# Patient Record
Sex: Female | Born: 1945 | Race: Black or African American | Hispanic: No | Smoking: Never smoker
Health system: Southern US, Community
[De-identification: ages and names within clinical notes are randomized; demographics above are authoritative.]

## PROBLEM LIST (undated history)

## (undated) DIAGNOSIS — I340 Nonrheumatic mitral (valve) insufficiency: Secondary | ICD-10-CM

## (undated) DIAGNOSIS — K219 Gastro-esophageal reflux disease without esophagitis: Secondary | ICD-10-CM

## (undated) DIAGNOSIS — I351 Nonrheumatic aortic (valve) insufficiency: Secondary | ICD-10-CM

## (undated) DIAGNOSIS — E538 Deficiency of other specified B group vitamins: Secondary | ICD-10-CM

## (undated) DIAGNOSIS — N182 Chronic kidney disease, stage 2 (mild): Secondary | ICD-10-CM

## (undated) DIAGNOSIS — M810 Age-related osteoporosis without current pathological fracture: Secondary | ICD-10-CM

## (undated) DIAGNOSIS — E7800 Pure hypercholesterolemia, unspecified: Secondary | ICD-10-CM

## (undated) DIAGNOSIS — I10 Essential (primary) hypertension: Secondary | ICD-10-CM

## (undated) DIAGNOSIS — D126 Benign neoplasm of colon, unspecified: Secondary | ICD-10-CM

## (undated) DIAGNOSIS — D709 Neutropenia, unspecified: Secondary | ICD-10-CM

## (undated) DIAGNOSIS — E559 Vitamin D deficiency, unspecified: Secondary | ICD-10-CM

---

## 2005-07-30 ENCOUNTER — Ambulatory Visit: Payer: Self-pay | Admitting: Internal Medicine

## 2006-01-08 ENCOUNTER — Ambulatory Visit: Payer: Self-pay | Admitting: Gastroenterology

## 2006-09-02 ENCOUNTER — Ambulatory Visit: Payer: Self-pay | Admitting: Internal Medicine

## 2007-09-06 ENCOUNTER — Ambulatory Visit: Payer: Self-pay | Admitting: Internal Medicine

## 2007-09-14 ENCOUNTER — Ambulatory Visit: Payer: Self-pay | Admitting: Internal Medicine

## 2008-07-20 ENCOUNTER — Emergency Department: Payer: Self-pay | Admitting: Emergency Medicine

## 2008-09-14 ENCOUNTER — Ambulatory Visit: Payer: Self-pay | Admitting: Internal Medicine

## 2009-08-14 ENCOUNTER — Ambulatory Visit: Payer: Self-pay | Admitting: Gastroenterology

## 2009-09-17 ENCOUNTER — Ambulatory Visit: Payer: Self-pay | Admitting: Internal Medicine

## 2009-09-19 ENCOUNTER — Ambulatory Visit: Payer: Self-pay | Admitting: Internal Medicine

## 2010-09-30 ENCOUNTER — Ambulatory Visit: Payer: Self-pay | Admitting: Internal Medicine

## 2011-04-06 ENCOUNTER — Emergency Department: Payer: Self-pay | Admitting: *Deleted

## 2011-04-09 ENCOUNTER — Ambulatory Visit: Payer: Self-pay | Admitting: Specialist

## 2011-04-11 ENCOUNTER — Ambulatory Visit: Payer: Self-pay | Admitting: Specialist

## 2011-08-30 ENCOUNTER — Emergency Department: Payer: Self-pay | Admitting: Emergency Medicine

## 2011-10-01 ENCOUNTER — Ambulatory Visit: Payer: Self-pay | Admitting: Internal Medicine

## 2011-10-02 ENCOUNTER — Ambulatory Visit: Payer: Self-pay | Admitting: Internal Medicine

## 2012-04-27 ENCOUNTER — Ambulatory Visit: Payer: Self-pay | Admitting: Internal Medicine

## 2012-10-05 ENCOUNTER — Ambulatory Visit: Payer: Self-pay | Admitting: Internal Medicine

## 2013-10-19 ENCOUNTER — Ambulatory Visit: Payer: Self-pay | Admitting: Internal Medicine

## 2013-11-01 ENCOUNTER — Ambulatory Visit: Payer: Self-pay | Admitting: Internal Medicine

## 2014-04-30 NOTE — Op Note (Signed)
PATIENT NAME:  Melanie Ali, Melanie Ali MR#:  [id number] DATE OF BIRTH:  21-Apr-1945  DATE OF PROCEDURE:  04/11/2011  PREOPERATIVE DIAGNOSIS: Displaced, depressed left calcaneus fracture.   POSTOPERATIVE DIAGNOSIS: Displaced, depressed left calcaneus fracture.   PROCEDURE:  Percutaneous reduction and internal fixation left calcaneus fracture.   SURGEON: Christophe Louis, M.D.   ANESTHESIA: General.   COMPLICATIONS: None.   TOURNIQUET TIME: Approximately 80 minutes.   DESCRIPTION OF PROCEDURE:  One gram of Ancef was given intravenously prior to the procedure. General anesthesia is induced and the patient is turned over into the prone position. The left foot, ankle, and leg are thoroughly prepped with alcohol and ChloraPrep and draped in standard sterile fashion. Under fluoroscopic control a Steinmann pin is inserted into the posterior heel for use as traction device. This is connected to a traction bow and a sterile cord and this was placed over a Mayo stand with 10 pounds longitudinal traction. In addition the fracture was manually manipulated by myself to provide longitudinal traction. Under fluoroscopic control a medium-sized K-wire was then inserted directly beneath the depressed lateral fragment of the posterior facet, and then this was manipulated up into essentially anatomic position. Internal fixation was then performed with three 4.0 cannulated cancellus screws. Final position of the fracture and the internal fixation was checked in the lateral and axial views and was seen to be essentially anatomic. The wounds were thoroughly irrigated multiple times with normal saline. Skin is closed with 4-0 nylon. A soft bulky dressing is applied with a sugar tong type splint. It should be noted that prior to closing the small wounds were infiltrated with 0.5% plain Marcaine. The tourniquet is released.      The patient is returned to the recovery room in satisfactory condition having tolerated the  procedure quite well.    ____________________________ Melanie Mallow, MD ces:bjt D: 04/11/2011 13:55:43 ET T: 04/11/2011 16:06:44 ET JOB#: [id number]  cc: Melanie Mallow, MD, <Dictator> Melanie Mallow MD ELECTRONICALLY SIGNED 04/16/2011 17:08

## 2014-11-06 ENCOUNTER — Other Ambulatory Visit: Payer: Self-pay | Admitting: Internal Medicine

## 2014-11-06 DIAGNOSIS — Z1231 Encounter for screening mammogram for malignant neoplasm of breast: Secondary | ICD-10-CM

## 2014-11-08 ENCOUNTER — Ambulatory Visit
Admission: RE | Admit: 2014-11-08 | Discharge: 2014-11-08 | Disposition: A | Discharge status: Home / Self Care | Payer: Medicare Other | Source: Ambulatory Visit | Attending: Internal Medicine | Admitting: Internal Medicine

## 2014-11-08 DIAGNOSIS — Z1231 Encounter for screening mammogram for malignant neoplasm of breast: Secondary | ICD-10-CM | POA: Diagnosis not present

## 2015-04-13 DIAGNOSIS — E559 Vitamin D deficiency, unspecified: Secondary | ICD-10-CM | POA: Diagnosis not present

## 2015-04-13 DIAGNOSIS — K219 Gastro-esophageal reflux disease without esophagitis: Secondary | ICD-10-CM | POA: Diagnosis not present

## 2015-04-13 DIAGNOSIS — E7800 Pure hypercholesterolemia, unspecified: Secondary | ICD-10-CM | POA: Diagnosis not present

## 2015-04-13 DIAGNOSIS — Z79899 Other long term (current) drug therapy: Secondary | ICD-10-CM | POA: Diagnosis not present

## 2015-04-13 DIAGNOSIS — Z86010 Personal history of colonic polyps: Secondary | ICD-10-CM | POA: Diagnosis not present

## 2015-04-13 DIAGNOSIS — M810 Age-related osteoporosis without current pathological fracture: Secondary | ICD-10-CM | POA: Diagnosis not present

## 2015-04-13 DIAGNOSIS — I10 Essential (primary) hypertension: Secondary | ICD-10-CM | POA: Diagnosis not present

## 2015-04-13 DIAGNOSIS — D709 Neutropenia, unspecified: Secondary | ICD-10-CM | POA: Diagnosis not present

## 2015-05-02 DIAGNOSIS — M810 Age-related osteoporosis without current pathological fracture: Secondary | ICD-10-CM | POA: Diagnosis not present

## 2015-05-17 DIAGNOSIS — Z86010 Personal history of colonic polyps: Secondary | ICD-10-CM | POA: Diagnosis not present

## 2015-07-16 ENCOUNTER — Encounter: Payer: Self-pay | Admitting: *Deleted

## 2015-07-17 ENCOUNTER — Ambulatory Visit
Admission: RE | Admit: 2015-07-17 | Discharge: 2015-07-17 | Disposition: A | Discharge status: Home / Self Care | Payer: PPO | Source: Ambulatory Visit | Attending: Gastroenterology | Admitting: Gastroenterology

## 2015-07-17 ENCOUNTER — Ambulatory Visit: Payer: PPO | Admitting: Anesthesiology

## 2015-07-17 ENCOUNTER — Encounter
Admission: RE | Disposition: A | Discharge status: Home / Self Care | Payer: Self-pay | Source: Ambulatory Visit | Attending: Gastroenterology

## 2015-07-17 DIAGNOSIS — Z1211 Encounter for screening for malignant neoplasm of colon: Secondary | ICD-10-CM | POA: Insufficient documentation

## 2015-07-17 DIAGNOSIS — Z79899 Other long term (current) drug therapy: Secondary | ICD-10-CM | POA: Insufficient documentation

## 2015-07-17 DIAGNOSIS — Z7951 Long term (current) use of inhaled steroids: Secondary | ICD-10-CM | POA: Diagnosis not present

## 2015-07-17 DIAGNOSIS — E559 Vitamin D deficiency, unspecified: Secondary | ICD-10-CM | POA: Diagnosis not present

## 2015-07-17 DIAGNOSIS — K219 Gastro-esophageal reflux disease without esophagitis: Secondary | ICD-10-CM | POA: Diagnosis not present

## 2015-07-17 DIAGNOSIS — M810 Age-related osteoporosis without current pathological fracture: Secondary | ICD-10-CM | POA: Insufficient documentation

## 2015-07-17 DIAGNOSIS — K5730 Diverticulosis of large intestine without perforation or abscess without bleeding: Secondary | ICD-10-CM | POA: Diagnosis not present

## 2015-07-17 DIAGNOSIS — I10 Essential (primary) hypertension: Secondary | ICD-10-CM | POA: Insufficient documentation

## 2015-07-17 DIAGNOSIS — E538 Deficiency of other specified B group vitamins: Secondary | ICD-10-CM | POA: Insufficient documentation

## 2015-07-17 DIAGNOSIS — E7800 Pure hypercholesterolemia, unspecified: Secondary | ICD-10-CM | POA: Diagnosis not present

## 2015-07-17 DIAGNOSIS — D125 Benign neoplasm of sigmoid colon: Secondary | ICD-10-CM | POA: Diagnosis not present

## 2015-07-17 DIAGNOSIS — K635 Polyp of colon: Secondary | ICD-10-CM | POA: Diagnosis not present

## 2015-07-17 DIAGNOSIS — D127 Benign neoplasm of rectosigmoid junction: Secondary | ICD-10-CM | POA: Diagnosis not present

## 2015-07-17 DIAGNOSIS — K5790 Diverticulosis of intestine, part unspecified, without perforation or abscess without bleeding: Secondary | ICD-10-CM | POA: Diagnosis not present

## 2015-07-17 DIAGNOSIS — Z86010 Personal history of colonic polyps: Secondary | ICD-10-CM | POA: Insufficient documentation

## 2015-07-17 HISTORY — PX: COLONOSCOPY WITH PROPOFOL: SHX5780

## 2015-07-17 HISTORY — DX: Pure hypercholesterolemia, unspecified: E78.00

## 2015-07-17 HISTORY — DX: Essential (primary) hypertension: I10

## 2015-07-17 HISTORY — DX: Vitamin D deficiency, unspecified: E55.9

## 2015-07-17 HISTORY — DX: Benign neoplasm of colon, unspecified: D12.6

## 2015-07-17 HISTORY — DX: Neutropenia, unspecified: D70.9

## 2015-07-17 HISTORY — DX: Age-related osteoporosis without current pathological fracture: M81.0

## 2015-07-17 HISTORY — DX: Deficiency of other specified B group vitamins: E53.8

## 2015-07-17 HISTORY — DX: Gastro-esophageal reflux disease without esophagitis: K21.9

## 2015-07-17 SURGERY — COLONOSCOPY WITH PROPOFOL
Anesthesia: General

## 2015-07-17 MED ORDER — PROPOFOL 10 MG/ML IV BOLUS
INTRAVENOUS | Status: DC | PRN
  Administered 2015-07-17: 100 mg via INTRAVENOUS

## 2015-07-17 MED ORDER — MIDAZOLAM HCL 5 MG/5ML IJ SOLN
INTRAMUSCULAR | Status: DC | PRN
  Administered 2015-07-17: 1 mg via INTRAVENOUS

## 2015-07-17 MED ORDER — PROPOFOL 500 MG/50ML IV EMUL
INTRAVENOUS | Status: DC | PRN
  Administered 2015-07-17: 140 ug/kg/min via INTRAVENOUS

## 2015-07-17 MED ORDER — GLYCOPYRROLATE 0.2 MG/ML IJ SOLN
INTRAMUSCULAR | Status: DC | PRN
  Administered 2015-07-17: 0.2 mg via INTRAVENOUS

## 2015-07-17 MED ORDER — SODIUM CHLORIDE 0.9 % IV SOLN
INTRAVENOUS | Status: DC
  Administered 2015-07-17: 07:00:00 via INTRAVENOUS
  Administered 2015-07-17: 1000 mL via INTRAVENOUS

## 2015-07-17 MED ORDER — LIDOCAINE 2% (20 MG/ML) 5 ML SYRINGE
INTRAMUSCULAR | Status: DC | PRN
  Administered 2015-07-17: 40 mg via INTRAVENOUS

## 2015-07-17 MED ORDER — FENTANYL CITRATE (PF) 100 MCG/2ML IJ SOLN
INTRAMUSCULAR | Status: DC | PRN
  Administered 2015-07-17: 50 ug via INTRAVENOUS

## 2015-07-17 MED ORDER — SODIUM CHLORIDE 0.9 % IV SOLN: INTRAVENOUS | Status: DC

## 2015-07-17 MED ORDER — EPHEDRINE SULFATE 50 MG/ML IJ SOLN
INTRAMUSCULAR | Status: DC | PRN
  Administered 2015-07-17: 10 mg via INTRAVENOUS

## 2015-07-17 NOTE — H&P (Signed)
Outpatient short stay form Pre-procedure 07/17/2015 7:10 AM Melanie Sails MD  Primary Physician: Dr. Genene Churn  Reason for visit:  Colonoscopy  History of present illness:  Patient is a 70 year old female presenting today for colonoscopy. She has a previous history of adenomatous colon polyps. She tolerated her prep well. She takes no recent aspirin products she takes no blood thinning agents.    Current facility-administered medications:  .  0.9 %  sodium chloride infusion, , Intravenous, Continuous, Melanie Sails, MD, Last Rate: 20 mL/hr at 07/17/15 0701, 1,000 mL at 07/17/15 0701 .  0.9 %  sodium chloride infusion, , Intravenous, Continuous, Melanie Sails, MD  Prescriptions prior to admission  Medication Sig Dispense Refill Last Dose  . alendronate (FOSAMAX) 70 MG tablet Take 70 mg by mouth once a week. Take with a full glass of water on an empty stomach.   Past Week at Unknown time  . amLODipine (NORVASC) 5 MG tablet Take 5 mg by mouth daily.   07/17/2015 at Unknown time  . cyanocobalamin 500 MCG tablet Take 500 mcg by mouth daily.   Past Week at Unknown time  . fluticasone (FLONASE) 50 MCG/ACT nasal spray Place 2 sprays into both nostrils daily.   Past Week at Unknown time  . lisinopril-hydrochlorothiazide (PRINZIDE,ZESTORETIC) 20-25 MG tablet Take 1 tablet by mouth daily.   07/17/2015 at 0530  . loratadine (CLARITIN) 10 MG tablet Take 10 mg by mouth daily.   Past Week at Unknown time  . naproxen sodium (ANAPROX) 220 MG tablet Take 220 mg by mouth 2 (two) times daily with a meal.   07/16/2015 at Unknown time  . omeprazole (PRILOSEC) 20 MG capsule Take 20 mg by mouth daily.   07/16/2015 at Unknown time  . polyethylene glycol powder (GLYCOLAX/MIRALAX) powder Take 1 Container by mouth once.   07/16/2015 at Unknown time  . Vitamin D, Ergocalciferol, (DRISDOL) 50000 units CAPS capsule Take 50,000 Units by mouth every 14 (fourteen) days.   Past Week at Unknown time     No Known  Allergies   Past Medical History  Diagnosis Date  . GERD (gastroesophageal reflux disease)   . Hypercholesterolemia   . Hypertension   . Osteoporosis   . Vitamin D deficiency   . B12 deficiency   . Adenomatous polyp of colon   . Neutropenia ([place])     Review of systems:      Physical Exam    Heart and lungs: Regular rate and rhythm without rub or gallop.    HEENT: Normocephalic atraumatic eyes are anicteric    Other:     Pertinant exam for procedure: Soft nontender nondistended bowel sounds positive normoactive.    Planned proceedures: Colonoscopy and indicated procedures. I have discussed the risks benefits and complications of procedures to include not limited to bleeding, infection, perforation and the risk of sedation and the patient wishes to proceed.    Melanie Sails, MD Gastroenterology 07/17/2015  7:10 AM

## 2015-07-17 NOTE — Anesthesia Preprocedure Evaluation (Signed)
Anesthesia Evaluation  Patient identified by MRN, date of birth, ID band Patient awake    Reviewed: Allergy & Precautions, NPO status , Patient's Chart, lab work & pertinent test results  Airway Mallampati: II       Dental  (+) Teeth Intact, Caps   Pulmonary neg pulmonary ROS,    breath sounds clear to auscultation       Cardiovascular Exercise Tolerance: Good hypertension, Pt. on medications  Rhythm:Regular     Neuro/Psych negative neurological ROS  negative psych ROS   GI/Hepatic Neg liver ROS, GERD  ,  Endo/Other  negative endocrine ROS  Renal/GU negative Renal ROS     Musculoskeletal negative musculoskeletal ROS (+)   Abdominal Normal abdominal exam  (+)   Peds  Hematology negative hematology ROS (+)   Anesthesia Other Findings   Reproductive/Obstetrics                             Anesthesia Physical Anesthesia Plan  ASA: II  Anesthesia Plan: General   Post-op Pain Management:    Induction: Intravenous  Airway Management Planned: Natural Airway and Nasal Cannula  Additional Equipment:   Intra-op Plan:   Post-operative Plan:   Informed Consent: I have reviewed the patients History and Physical, chart, labs and discussed the procedure including the risks, benefits and alternatives for the proposed anesthesia with the patient or authorized representative who has indicated his/her understanding and acceptance.     Plan Discussed with: CRNA  Anesthesia Plan Comments:         Anesthesia Quick Evaluation

## 2015-07-17 NOTE — Transfer of Care (Signed)
Immediate Anesthesia Transfer of Care Note  Patient: Melanie Ali  Procedure(s) Performed: Procedure(s): COLONOSCOPY WITH PROPOFOL (N/A)  Patient Location: PACU and Endoscopy Unit  Anesthesia Type:General  Level of Consciousness: sedated  Airway & Oxygen Therapy: Patient Spontanous Breathing and Patient connected to nasal cannula oxygen  Post-op Assessment: Report given to RN and Post -op Vital signs reviewed and stable  Post vital signs: stable  Last Vitals:  Filed Vitals:   07/17/15 0650  BP: 168/90  Pulse: 57  Temp: 36.3 C  Resp: 16    Last Pain: There were no vitals filed for this visit.       Complications: No apparent anesthesia complications

## 2015-07-17 NOTE — Anesthesia Postprocedure Evaluation (Signed)
Anesthesia Post Note  Patient: Melanie Ali  Procedure(s) Performed: Procedure(s) (LRB): COLONOSCOPY WITH PROPOFOL (N/A)  Patient location during evaluation: PACU Anesthesia Type: General Level of consciousness: awake and alert Pain management: satisfactory to patient Vital Signs Assessment: post-procedure vital signs reviewed and stable Respiratory status: nonlabored ventilation Cardiovascular status: blood pressure returned to baseline Anesthetic complications: no    Last Vitals:  Filed Vitals:   07/17/15 0807 07/17/15 0810  BP: 104/57 112/66  Pulse: 67 65  Temp: 36 C   Resp: 16 18    Last Pain: There were no vitals filed for this visit.               VAN STAVEREN,Pasha Broad

## 2015-07-17 NOTE — Op Note (Signed)
[facility] Gastroenterology Patient Name: Melanie Ali Procedure Date: 07/17/2015 8:01 AM MRN: [id number] Account #: [id number] Date of Birth: 1945/04/29 Admit Type: Outpatient Age: 70 Room: [facility] ENDO ROOM 3 Gender: Female Note Status: Finalized Procedure:            Colonoscopy Indications:          Personal history of colonic polyps Providers:            [name], MD Referring MD:         Christena Flake. Raechel Ache, MD (Referring MD) Medicines:            Monitored Anesthesia Care Complications:        No immediate complications. Procedure:            Pre-Anesthesia Assessment:                       - ASA Grade Assessment: II - A patient with mild                        systemic disease.                       After obtaining informed consent, the colonoscope was                        passed under direct vision. Throughout the procedure,                        the patient's blood pressure, pulse, and oxygen                        saturations were monitored continuously. The                        Colonoscope was introduced through the anus and                        advanced to the the cecum, identified by appendiceal                        orifice and ileocecal valve. The colonoscopy was                        unusually difficult due to significant looping and a                        tortuous colon. Successful completion of the procedure                        was aided by changing the patient to a supine position,                        changing the patient to a prone position and using                        manual pressure. The patient tolerated the procedure                        well. The quality of the bowel preparation was good. Findings:      A 3 mm  polyp was found in the recto-sigmoid colon. The polyp was       sessile. The polyp was removed with a cold biopsy forceps. Resection and       retrieval were complete.      Multiple small and  large-mouthed diverticula were found in the sigmoid       colon and descending colon.      The retroflexed view of the distal rectum and anal verge was normal and       showed no anal or rectal abnormalities.      The digital rectal exam was normal. Impression:           - One 3 mm polyp at the recto-sigmoid colon, removed                        with a cold biopsy forceps. Resected and retrieved.                       - Diverticulosis in the sigmoid colon and in the                        descending colon.                       - The distal rectum and anal verge are normal on                        retroflexion view. Recommendation:       - Discharge patient to home. Procedure Code(s):    --- Professional ---                       [phone], Colonoscopy, flexible; with biopsy, single or                        multiple Diagnosis Code(s):    --- Professional ---                       D12.7, Benign neoplasm of rectosigmoid junction                       Z86.010, Personal history of colonic polyps                       K57.30, Diverticulosis of large intestine without                        perforation or abscess without bleeding CPT copyright 2016 American Medical Association. All rights reserved. The codes documented in this report are preliminary and upon coder review may  be revised to meet current compliance requirements. [name], MD 07/17/2015 8:06:14 AM This report has been signed electronically. Number of Addenda: 0 Note Initiated On: 07/17/2015 8:01 AM      [facility]

## 2015-07-22 ENCOUNTER — Encounter: Payer: Self-pay | Admitting: Gastroenterology

## 2015-07-31 LAB — SURGICAL PATHOLOGY

## 2015-08-14 DIAGNOSIS — I10 Essential (primary) hypertension: Secondary | ICD-10-CM | POA: Diagnosis not present

## 2015-08-14 DIAGNOSIS — J069 Acute upper respiratory infection, unspecified: Secondary | ICD-10-CM | POA: Diagnosis not present

## 2015-08-14 DIAGNOSIS — J309 Allergic rhinitis, unspecified: Secondary | ICD-10-CM | POA: Diagnosis not present

## 2015-10-17 DIAGNOSIS — E538 Deficiency of other specified B group vitamins: Secondary | ICD-10-CM | POA: Diagnosis not present

## 2015-10-17 DIAGNOSIS — E7800 Pure hypercholesterolemia, unspecified: Secondary | ICD-10-CM | POA: Diagnosis not present

## 2015-10-17 DIAGNOSIS — E559 Vitamin D deficiency, unspecified: Secondary | ICD-10-CM | POA: Diagnosis not present

## 2015-10-17 DIAGNOSIS — K219 Gastro-esophageal reflux disease without esophagitis: Secondary | ICD-10-CM | POA: Diagnosis not present

## 2015-10-17 DIAGNOSIS — Z23 Encounter for immunization: Secondary | ICD-10-CM | POA: Diagnosis not present

## 2015-10-17 DIAGNOSIS — I10 Essential (primary) hypertension: Secondary | ICD-10-CM | POA: Diagnosis not present

## 2015-10-17 DIAGNOSIS — M810 Age-related osteoporosis without current pathological fracture: Secondary | ICD-10-CM | POA: Diagnosis not present

## 2015-10-17 DIAGNOSIS — Z79899 Other long term (current) drug therapy: Secondary | ICD-10-CM | POA: Diagnosis not present

## 2015-10-17 DIAGNOSIS — D709 Neutropenia, unspecified: Secondary | ICD-10-CM | POA: Diagnosis not present

## 2015-11-24 DIAGNOSIS — H9202 Otalgia, left ear: Secondary | ICD-10-CM | POA: Diagnosis not present

## 2015-11-24 DIAGNOSIS — R51 Headache: Secondary | ICD-10-CM | POA: Diagnosis not present

## 2015-11-26 DIAGNOSIS — I10 Essential (primary) hypertension: Secondary | ICD-10-CM | POA: Diagnosis not present

## 2015-11-26 DIAGNOSIS — H018 Other specified inflammations of eyelid: Secondary | ICD-10-CM | POA: Diagnosis not present

## 2015-11-26 DIAGNOSIS — B0239 Other herpes zoster eye disease: Secondary | ICD-10-CM | POA: Diagnosis not present

## 2015-11-26 DIAGNOSIS — B0230 Zoster ocular disease, unspecified: Secondary | ICD-10-CM | POA: Diagnosis not present

## 2015-12-04 DIAGNOSIS — B0239 Other herpes zoster eye disease: Secondary | ICD-10-CM | POA: Diagnosis not present

## 2015-12-04 DIAGNOSIS — H018 Other specified inflammations of eyelid: Secondary | ICD-10-CM | POA: Diagnosis not present

## 2015-12-11 DIAGNOSIS — I10 Essential (primary) hypertension: Secondary | ICD-10-CM | POA: Diagnosis not present

## 2015-12-11 DIAGNOSIS — B0230 Zoster ocular disease, unspecified: Secondary | ICD-10-CM | POA: Diagnosis not present

## 2015-12-11 DIAGNOSIS — L299 Pruritus, unspecified: Secondary | ICD-10-CM | POA: Diagnosis not present

## 2015-12-17 DIAGNOSIS — K219 Gastro-esophageal reflux disease without esophagitis: Secondary | ICD-10-CM | POA: Diagnosis not present

## 2015-12-17 DIAGNOSIS — M8580 Other specified disorders of bone density and structure, unspecified site: Secondary | ICD-10-CM | POA: Diagnosis not present

## 2015-12-17 DIAGNOSIS — D709 Neutropenia, unspecified: Secondary | ICD-10-CM | POA: Diagnosis not present

## 2015-12-17 DIAGNOSIS — I10 Essential (primary) hypertension: Secondary | ICD-10-CM | POA: Diagnosis not present

## 2015-12-17 DIAGNOSIS — E7800 Pure hypercholesterolemia, unspecified: Secondary | ICD-10-CM | POA: Diagnosis not present

## 2015-12-17 DIAGNOSIS — E559 Vitamin D deficiency, unspecified: Secondary | ICD-10-CM | POA: Diagnosis not present

## 2015-12-17 DIAGNOSIS — B0230 Zoster ocular disease, unspecified: Secondary | ICD-10-CM | POA: Diagnosis not present

## 2015-12-26 DIAGNOSIS — B0230 Zoster ocular disease, unspecified: Secondary | ICD-10-CM | POA: Diagnosis not present

## 2016-01-09 DIAGNOSIS — B0230 Zoster ocular disease, unspecified: Secondary | ICD-10-CM | POA: Diagnosis not present

## 2016-01-30 DIAGNOSIS — B0230 Zoster ocular disease, unspecified: Secondary | ICD-10-CM | POA: Diagnosis not present

## 2016-02-04 DIAGNOSIS — L810 Postinflammatory hyperpigmentation: Secondary | ICD-10-CM | POA: Diagnosis not present

## 2016-02-04 DIAGNOSIS — B0229 Other postherpetic nervous system involvement: Secondary | ICD-10-CM | POA: Diagnosis not present

## 2016-02-04 DIAGNOSIS — L720 Epidermal cyst: Secondary | ICD-10-CM | POA: Diagnosis not present

## 2016-04-16 DIAGNOSIS — B029 Zoster without complications: Secondary | ICD-10-CM | POA: Diagnosis not present

## 2016-04-16 DIAGNOSIS — Z1159 Encounter for screening for other viral diseases: Secondary | ICD-10-CM | POA: Diagnosis not present

## 2016-04-16 DIAGNOSIS — M810 Age-related osteoporosis without current pathological fracture: Secondary | ICD-10-CM | POA: Diagnosis not present

## 2016-04-16 DIAGNOSIS — E7800 Pure hypercholesterolemia, unspecified: Secondary | ICD-10-CM | POA: Diagnosis not present

## 2016-04-16 DIAGNOSIS — E538 Deficiency of other specified B group vitamins: Secondary | ICD-10-CM | POA: Diagnosis not present

## 2016-04-16 DIAGNOSIS — I10 Essential (primary) hypertension: Secondary | ICD-10-CM | POA: Diagnosis not present

## 2016-04-16 DIAGNOSIS — Z79899 Other long term (current) drug therapy: Secondary | ICD-10-CM | POA: Diagnosis not present

## 2016-04-16 DIAGNOSIS — K219 Gastro-esophageal reflux disease without esophagitis: Secondary | ICD-10-CM | POA: Diagnosis not present

## 2016-04-16 DIAGNOSIS — E559 Vitamin D deficiency, unspecified: Secondary | ICD-10-CM | POA: Diagnosis not present

## 2016-04-16 DIAGNOSIS — D709 Neutropenia, unspecified: Secondary | ICD-10-CM | POA: Diagnosis not present

## 2016-08-04 DIAGNOSIS — B0239 Other herpes zoster eye disease: Secondary | ICD-10-CM | POA: Diagnosis not present

## 2016-08-07 DIAGNOSIS — B0239 Other herpes zoster eye disease: Secondary | ICD-10-CM | POA: Diagnosis not present

## 2016-10-20 DIAGNOSIS — E538 Deficiency of other specified B group vitamins: Secondary | ICD-10-CM | POA: Diagnosis not present

## 2016-10-20 DIAGNOSIS — D709 Neutropenia, unspecified: Secondary | ICD-10-CM | POA: Diagnosis not present

## 2016-10-20 DIAGNOSIS — E7800 Pure hypercholesterolemia, unspecified: Secondary | ICD-10-CM | POA: Diagnosis not present

## 2016-10-20 DIAGNOSIS — E559 Vitamin D deficiency, unspecified: Secondary | ICD-10-CM | POA: Diagnosis not present

## 2016-10-20 DIAGNOSIS — Z1231 Encounter for screening mammogram for malignant neoplasm of breast: Secondary | ICD-10-CM | POA: Diagnosis not present

## 2016-10-20 DIAGNOSIS — Z23 Encounter for immunization: Secondary | ICD-10-CM | POA: Diagnosis not present

## 2016-10-20 DIAGNOSIS — B029 Zoster without complications: Secondary | ICD-10-CM | POA: Diagnosis not present

## 2016-10-20 DIAGNOSIS — K219 Gastro-esophageal reflux disease without esophagitis: Secondary | ICD-10-CM | POA: Diagnosis not present

## 2016-10-20 DIAGNOSIS — I10 Essential (primary) hypertension: Secondary | ICD-10-CM | POA: Diagnosis not present

## 2016-10-20 DIAGNOSIS — Z79899 Other long term (current) drug therapy: Secondary | ICD-10-CM | POA: Diagnosis not present

## 2016-10-20 DIAGNOSIS — N183 Chronic kidney disease, stage 3 (moderate): Secondary | ICD-10-CM | POA: Diagnosis not present

## 2016-10-21 ENCOUNTER — Other Ambulatory Visit: Payer: Self-pay | Admitting: Internal Medicine

## 2016-10-21 DIAGNOSIS — Z1231 Encounter for screening mammogram for malignant neoplasm of breast: Secondary | ICD-10-CM

## 2016-11-11 ENCOUNTER — Ambulatory Visit
Admission: RE | Admit: 2016-11-11 | Discharge: 2016-11-11 | Disposition: A | Discharge status: Home / Self Care | Payer: PPO | Source: Ambulatory Visit | Attending: Internal Medicine | Admitting: Internal Medicine

## 2016-11-11 DIAGNOSIS — Z1231 Encounter for screening mammogram for malignant neoplasm of breast: Secondary | ICD-10-CM | POA: Insufficient documentation

## 2017-04-20 DIAGNOSIS — E559 Vitamin D deficiency, unspecified: Secondary | ICD-10-CM | POA: Diagnosis not present

## 2017-04-20 DIAGNOSIS — Z79899 Other long term (current) drug therapy: Secondary | ICD-10-CM | POA: Diagnosis not present

## 2017-04-20 DIAGNOSIS — I10 Essential (primary) hypertension: Secondary | ICD-10-CM | POA: Diagnosis not present

## 2017-04-20 DIAGNOSIS — Z23 Encounter for immunization: Secondary | ICD-10-CM | POA: Diagnosis not present

## 2017-04-20 DIAGNOSIS — D709 Neutropenia, unspecified: Secondary | ICD-10-CM | POA: Diagnosis not present

## 2017-04-20 DIAGNOSIS — M810 Age-related osteoporosis without current pathological fracture: Secondary | ICD-10-CM | POA: Diagnosis not present

## 2017-04-20 DIAGNOSIS — K219 Gastro-esophageal reflux disease without esophagitis: Secondary | ICD-10-CM | POA: Diagnosis not present

## 2017-04-20 DIAGNOSIS — E7800 Pure hypercholesterolemia, unspecified: Secondary | ICD-10-CM | POA: Diagnosis not present

## 2017-04-20 DIAGNOSIS — E538 Deficiency of other specified B group vitamins: Secondary | ICD-10-CM | POA: Diagnosis not present

## 2017-05-05 DIAGNOSIS — M8588 Other specified disorders of bone density and structure, other site: Secondary | ICD-10-CM | POA: Diagnosis not present

## 2017-06-23 DIAGNOSIS — E7800 Pure hypercholesterolemia, unspecified: Secondary | ICD-10-CM | POA: Diagnosis not present

## 2017-06-23 DIAGNOSIS — Z79899 Other long term (current) drug therapy: Secondary | ICD-10-CM | POA: Diagnosis not present

## 2017-10-20 DIAGNOSIS — E538 Deficiency of other specified B group vitamins: Secondary | ICD-10-CM | POA: Diagnosis not present

## 2017-10-20 DIAGNOSIS — N182 Chronic kidney disease, stage 2 (mild): Secondary | ICD-10-CM | POA: Diagnosis not present

## 2017-10-20 DIAGNOSIS — E7800 Pure hypercholesterolemia, unspecified: Secondary | ICD-10-CM | POA: Diagnosis not present

## 2017-10-20 DIAGNOSIS — Z0000 Encounter for general adult medical examination without abnormal findings: Secondary | ICD-10-CM | POA: Diagnosis not present

## 2017-10-20 DIAGNOSIS — Z23 Encounter for immunization: Secondary | ICD-10-CM | POA: Diagnosis not present

## 2017-10-20 DIAGNOSIS — J309 Allergic rhinitis, unspecified: Secondary | ICD-10-CM | POA: Diagnosis not present

## 2017-10-20 DIAGNOSIS — K219 Gastro-esophageal reflux disease without esophagitis: Secondary | ICD-10-CM | POA: Diagnosis not present

## 2017-10-20 DIAGNOSIS — I10 Essential (primary) hypertension: Secondary | ICD-10-CM | POA: Diagnosis not present

## 2017-10-20 DIAGNOSIS — Z79899 Other long term (current) drug therapy: Secondary | ICD-10-CM | POA: Diagnosis not present

## 2017-10-20 DIAGNOSIS — E559 Vitamin D deficiency, unspecified: Secondary | ICD-10-CM | POA: Diagnosis not present

## 2017-10-20 DIAGNOSIS — D709 Neutropenia, unspecified: Secondary | ICD-10-CM | POA: Diagnosis not present

## 2018-05-03 DIAGNOSIS — E538 Deficiency of other specified B group vitamins: Secondary | ICD-10-CM | POA: Diagnosis not present

## 2018-05-03 DIAGNOSIS — K219 Gastro-esophageal reflux disease without esophagitis: Secondary | ICD-10-CM | POA: Diagnosis not present

## 2018-05-03 DIAGNOSIS — I10 Essential (primary) hypertension: Secondary | ICD-10-CM | POA: Diagnosis not present

## 2018-05-03 DIAGNOSIS — Z79899 Other long term (current) drug therapy: Secondary | ICD-10-CM | POA: Diagnosis not present

## 2018-05-03 DIAGNOSIS — D709 Neutropenia, unspecified: Secondary | ICD-10-CM | POA: Diagnosis not present

## 2018-05-03 DIAGNOSIS — N183 Chronic kidney disease, stage 3 (moderate): Secondary | ICD-10-CM | POA: Diagnosis not present

## 2018-05-03 DIAGNOSIS — J309 Allergic rhinitis, unspecified: Secondary | ICD-10-CM | POA: Diagnosis not present

## 2018-05-03 DIAGNOSIS — R0789 Other chest pain: Secondary | ICD-10-CM | POA: Diagnosis not present

## 2018-05-03 DIAGNOSIS — E559 Vitamin D deficiency, unspecified: Secondary | ICD-10-CM | POA: Diagnosis not present

## 2018-05-03 DIAGNOSIS — E7800 Pure hypercholesterolemia, unspecified: Secondary | ICD-10-CM | POA: Diagnosis not present

## 2018-05-07 ENCOUNTER — Emergency Department
Admission: EM | Admit: 2018-05-07 | Discharge: 2018-05-07 | Disposition: A | Discharge status: Home / Self Care | Payer: PPO | Attending: Emergency Medicine | Admitting: Emergency Medicine

## 2018-05-07 ENCOUNTER — Other Ambulatory Visit: Payer: Self-pay

## 2018-05-07 ENCOUNTER — Emergency Department: Payer: PPO

## 2018-05-07 DIAGNOSIS — R079 Chest pain, unspecified: Secondary | ICD-10-CM | POA: Diagnosis not present

## 2018-05-07 DIAGNOSIS — I10 Essential (primary) hypertension: Secondary | ICD-10-CM | POA: Insufficient documentation

## 2018-05-07 DIAGNOSIS — Z79899 Other long term (current) drug therapy: Secondary | ICD-10-CM | POA: Insufficient documentation

## 2018-05-07 DIAGNOSIS — Z03818 Encounter for observation for suspected exposure to other biological agents ruled out: Secondary | ICD-10-CM | POA: Diagnosis not present

## 2018-05-07 DIAGNOSIS — R0602 Shortness of breath: Secondary | ICD-10-CM | POA: Insufficient documentation

## 2018-05-07 DIAGNOSIS — Z20828 Contact with and (suspected) exposure to other viral communicable diseases: Secondary | ICD-10-CM | POA: Insufficient documentation

## 2018-05-07 LAB — CBC
HCT: 39.3 % (ref 36.0–46.0)
Hemoglobin: 12.9 g/dL (ref 12.0–15.0)
MCH: 29.5 pg (ref 26.0–34.0)
MCHC: 32.8 g/dL (ref 30.0–36.0)
MCV: 89.9 fL (ref 80.0–100.0)
Platelets: 270 10*3/uL (ref 150–400)
RBC: 4.37 MIL/uL (ref 3.87–5.11)
RDW: 13.8 % (ref 11.5–15.5)
WBC: 6.6 10*3/uL (ref 4.0–10.5)
nRBC: 0 % (ref 0.0–0.2)

## 2018-05-07 LAB — BASIC METABOLIC PANEL
Anion gap: 9 (ref 5–15)
BUN: 11 mg/dL (ref 8–23)
CO2: 27 mmol/L (ref 22–32)
Calcium: 10.5 mg/dL — ABNORMAL HIGH (ref 8.9–10.3)
Chloride: 103 mmol/L (ref 98–111)
Creatinine, Ser: 0.9 mg/dL (ref 0.44–1.00)
GFR calc Af Amer: >60 mL/min (ref >60)
GFR calc non Af Amer: >60 mL/min (ref >60)
Glucose, Bld: 106 mg/dL — ABNORMAL HIGH (ref 70–99)
Potassium: 4 mmol/L (ref 3.5–5.1)
Sodium: 139 mmol/L (ref 135–145)

## 2018-05-07 LAB — TROPONIN I
Troponin I: <0.03 ng/mL (ref <0.03)
Troponin I: <0.03 ng/mL (ref <0.03)

## 2018-05-07 MED ORDER — AMLODIPINE BESYLATE 5 MG PO TABS
10.0000 mg | ORAL_TABLET | ORAL | Status: AC
  Administered 2018-05-07: 10 mg via ORAL
  Filled 2018-05-07: qty 2

## 2018-05-07 NOTE — ED Triage Notes (Signed)
Chest pain and SOB since Saturday. Denies cough, fever or sore throat. Pt reports she wants to be tested for COVID, concerned due to son being sent to [facility] today to be tested for COVID due to fever with pending results. Pt alert and oriented X4, active, cooperative, pt in NAD. RR even and unlabored, color WNL.

## 2018-05-07 NOTE — ED Notes (Signed)
E-signature pad not working in room at this time. Paper copy printed and signed by pt for scanning into EMR.

## 2018-05-07 NOTE — ED Notes (Signed)
First Nurse Note: Pt to ED from [facility] for Chest pain, Shortness of breath, headache, and hypertension, per [place] pts BP was 170/118. Pt is in NAD.

## 2018-05-07 NOTE — ED Provider Notes (Signed)
[facility] Emergency Department Provider Note   ____________________________________________   First MD Initiated Contact with Patient 05/07/18 1619     (approximate)  I have reviewed the triage vital signs and the nursing notes.   HISTORY  Chief Complaint Chest Pain and Shortness of Breath    HPI Melanie Ali is a 73 y.o. female reports has been experiencing some chest discomfort and shortness of breath off and on for about a week.  She came today, she asked me specifically that she is here to get a coronavirus test.  She has not been around anyone known to have it and has not any fevers chills or body aches, but she reports her son who is on dialysis went to [facility] to have a test done today because he started experiencing body aches.  She denies that she feels like she has the flu or any infection.  She reports mostly that for about a week now that she will notice a slight discomfort in her chest, and sometimes will radiate towards her left arm.  It was apparent yesterday when she was out for a walk, she said she had to slow down her walking because that was causing her to feel a pain in her chest that feels like a bit of a pressure on the left side.  She has not had any pain for about 3 to 4 hours now and did took aspirin at home already  She denies personal history of heart disease.  Does have high blood pressure and reports last time she saw her doctor this week they told her that they wanted to double her dose of home Norvasc to 10 mg each night which she has not started doing yet.  She takes lisinopril each morning and Norvasc at night which she has not had yet today   Past Medical History:  Diagnosis Date  . Adenomatous polyp of colon   . B12 deficiency   . GERD (gastroesophageal reflux disease)   . Hypercholesterolemia   . Hypertension   . Neutropenia ([place])   . Osteoporosis   . Vitamin D deficiency     There are no active problems to  display for this patient.   Past Surgical History:  Procedure Laterality Date  . COLONOSCOPY WITH PROPOFOL N/A 07/17/2015   Procedure: COLONOSCOPY WITH PROPOFOL;  Surgeon: [name], MD;  Location: [facility] ENDOSCOPY;  Service: Endoscopy;  Laterality: N/A;    Prior to Admission medications   Medication Sig Start Date End Date Taking? Authorizing Provider  alendronate (FOSAMAX) 70 MG tablet Take 70 mg by mouth once a week. Take with a full glass of water on an empty stomach.    [provider]  amLODipine (NORVASC) 5 MG tablet Take 5 mg by mouth daily.    [provider]  cyanocobalamin 500 MCG tablet Take 500 mcg by mouth daily.    [provider]  fluticasone (FLONASE) 50 MCG/ACT nasal spray Place 2 sprays into both nostrils daily.    [provider]  lisinopril-hydrochlorothiazide (PRINZIDE,ZESTORETIC) 20-25 MG tablet Take 1 tablet by mouth daily.    [provider]  loratadine (CLARITIN) 10 MG tablet Take 10 mg by mouth daily.    [provider]  naproxen sodium (ANAPROX) 220 MG tablet Take 220 mg by mouth 2 (two) times daily with a meal.    [provider]  omeprazole (PRILOSEC) 20 MG capsule Take 20 mg by mouth daily.    [provider]  polyethylene glycol powder (GLYCOLAX/MIRALAX) powder Take 1 Container by mouth once.    [provider]  Vitamin D, Ergocalciferol, (DRISDOL) 50000 units CAPS capsule Take 50,000 Units by mouth every 14 (fourteen) days.    [provider]    Allergies Patient has no known allergies.  Family History  Problem Relation Age of Onset  . Breast cancer Neg Hx     Social History Social History   Tobacco Use  . Smoking status: Never Smoker  . Smokeless tobacco: Never Used  Substance Use Topics  . Alcohol use: No  . Drug use: No    Review of Systems Constitutional: No fever/chills Eyes: No visual changes. ENT: No sore throat. Cardiovascular: The HPI,  none at present Respiratory: See HPI, none at present gastrointestinal: No abdominal pain.   Genitourinary: Negative for dysuria. Musculoskeletal: Negative for back pain. Skin: Negative for rash. Neurological: Negative for headaches, areas of focal weakness or numbness.    ____________________________________________   PHYSICAL EXAM:  VITAL SIGNS: ED Triage Vitals  Enc Vitals Group     BP 05/07/18 1613 (!) 202/97     Pulse Rate 05/07/18 1613 88     Resp 05/07/18 1613 18     Temp 05/07/18 1613 98.1 F (36.7 C)     Temp Source 05/07/18 1613 Oral     SpO2 05/07/18 1613 100 %     Weight 05/07/18 1614 145 lb (65.8 kg)     Height 05/07/18 1614 5\' 5"  (1.651 m)     Head Circumference --      Peak Flow --      Pain Score 05/07/18 1614 5     Pain Loc --      Pain Edu? --      Excl. in [place]? --     Constitutional: Alert and oriented. Well appearing and in no acute distress. Eyes: Conjunctivae are normal. Head: Atraumatic. Nose: No congestion/rhinnorhea. Mouth/Throat: Mucous membranes are moist. Neck: No stridor.  Cardiovascular: Normal rate, regular rhythm. Grossly normal heart sounds.  Good peripheral circulation. Respiratory: Normal respiratory effort.  No retractions. Lungs CTAB. Gastrointestinal: Soft and nontender. No distention. Musculoskeletal: No lower extremity tenderness nor edema. Neurologic:  Normal speech and language. No gross focal neurologic deficits are appreciated.  Skin:  Skin is warm, dry and intact. No rash noted. Psychiatric: Mood and affect are normal. Speech and behavior are normal.  ____________________________________________   LABS (all labs ordered are listed, but only abnormal results are displayed)  Labs Reviewed  BASIC METABOLIC PANEL - Abnormal; Notable for the following components:      Result Value   Glucose, Bld 106 (*)    Calcium 10.5 (*)    All other components within normal limits  NOVEL CORONAVIRUS, NAA (HOSPITAL ORDER, SEND-OUT TO  REF LAB)  CBC  TROPONIN I  TROPONIN I   ____________________________________________  EKG  Reviewed entered by me at 1610 Heart rate 80 QRS 90 QTc 04/09/1950 Normal sinus rhythm, slight ST abnormality including minimal  depressions in the inferior distribution.  No acute ST elevation, nonspecific T wave abnormality is seen in V4 and V5, ____________________________________________  RADIOLOGY  Dg Chest Portable 1 View  Result Date: 05/07/2018 CLINICAL DATA:  Chest pain and shortness of breath since Saturday. Possible Kovacs posterior. EXAM: PORTABLE CHEST 1 VIEW COMPARISON:  None. FINDINGS: Midline trachea. Normal heart size. Tortuous thoracic aorta. No pleural effusion or pneumothorax. Clear lungs. IMPRESSION: No acute cardiopulmonary disease. Electronically Signed   By: Marylyn Ishihara  Jobe Igo M.D.   On: 05/07/2018 17:27    Reviewed and x-ray is negative ____________________________________________   PROCEDURES  Procedure(s) performed: None  Procedures  Critical Care performed: No  ____________________________________________   INITIAL IMPRESSION / ASSESSMENT AND PLAN / ED COURSE  Pertinent labs & imaging results that were available during my care of the patient were reviewed by me and considered in my medical decision making (see chart for details).  Patient reports to me that the reason she came in was due to coronavirus test.  She also relates that she is been having some chest discomfort and shortness of breath intermittently for a week.  Coronavirus send out test ordered as per hospital algorithm.  She does not appear to need hospitalization due to COVID appears quite stable afebrile normal oxygen saturation based on her clinical history I doubt she has coronavirus infection though she does report her son is being actively tested for it.   Differential diagnosis includes, but is not limited to, ACS, aortic dissection, pulmonary embolism, cardiac tamponade, pneumothorax, pneumonia,  pericarditis, myocarditis, GI-related causes including esophagitis/gastritis, and musculoskeletal chest wall pain.    I discussed her case including EKG with slight abnormality inferiorly with Dr. Ubaldo Glassing.  He advised that if her second troponin is negative it is not unreasonable to discharge her with close follow-up which she already has scheduled for this coming week.  I discussed with the patient, I did offer her admission for further work-up of her chest pain and go at the present time she is chest pain-free.  She is hesitant to be admitted due to coronavirus, and I discussed with her and with shared medical decision making I think it is not unreasonable for her to go home with careful return precautions which I discussed with her in follow-up regarding chest pain.  She does not show evidence of an acute coronary syndrome, though I am slightly concerned she could have developed some unstable angina but given coronavirus this presents in her concerns and our discussion we will discharge her with very careful return precautions of her second troponin is negative.  She is very agreeable with this plan.     ----------------------------------------- 9:08 PM on 05/07/2018 ----------------------------------------- Second troponin also normal.  Patient asymptomatic.  She is well aware of her elevated blood pressure and actually has been speaking to and saw her primary care doctor who is been adjusting his medications as of this week she tells me.  She is not any headache, no ongoing chest pain no ongoing shortness of breath, no blurred vision no symptoms of hypertensive emergency.  Discussed with the patient, she is not interested in staying in the hospital tonight for rule out of heart attack and to address her blood pressure, shared medical decision making also spoke with Dr. Ubaldo Glassing of cardiology all in agreement that she may go home with careful return precautions which she has been provided and discussed with  her.  Return precautions and treatment recommendations and follow-up discussed with the patient who is agreeable with the plan.    ____________________________________________   FINAL CLINICAL IMPRESSION(S) / ED DIAGNOSES  Final diagnoses:  Chest pain, unspecified type        Note:  This document was prepared using Dragon voice recognition software and may include unintentional dictation errors       Delman Kitten, MD 05/07/18 2108

## 2018-05-07 NOTE — Discharge Instructions (Addendum)
You have been seen in the Emergency Department (ED) today for chest pain.  As we have discussed todays test results are normal, but you may require further testing.  Please follow up with the recommended doctor as instructed above in these documents regarding todays emergent visit and your recent symptoms to discuss further management.  Continue to take your regular medications. If you are not doing so already, please also take a daily baby aspirin (81 mg), at least until you follow up with your doctor.  Please continue to follow-up with cardiology this week as scheduled. Also call your primary doctor Monday to discuss your blood pressure medications.   Return to the Emergency Department (ED) if you experience any further chest pain/pressure/tightness, difficulty breathing, or sudden sweating, or other symptoms that concern you.

## 2018-05-09 LAB — NOVEL CORONAVIRUS, NAA (HOSP ORDER, SEND-OUT TO REF LAB; TAT 18-24 HRS): SARS-CoV-2, NAA: NOT DETECTED

## 2018-05-12 DIAGNOSIS — R0789 Other chest pain: Secondary | ICD-10-CM | POA: Diagnosis not present

## 2018-05-12 DIAGNOSIS — R079 Chest pain, unspecified: Secondary | ICD-10-CM | POA: Diagnosis not present

## 2018-05-12 DIAGNOSIS — N183 Chronic kidney disease, stage 3 (moderate): Secondary | ICD-10-CM | POA: Diagnosis not present

## 2018-05-12 DIAGNOSIS — I10 Essential (primary) hypertension: Secondary | ICD-10-CM | POA: Diagnosis not present

## 2018-05-24 DIAGNOSIS — R079 Chest pain, unspecified: Secondary | ICD-10-CM | POA: Diagnosis not present

## 2018-06-02 DIAGNOSIS — I10 Essential (primary) hypertension: Secondary | ICD-10-CM | POA: Diagnosis not present

## 2018-06-02 DIAGNOSIS — R079 Chest pain, unspecified: Secondary | ICD-10-CM | POA: Diagnosis not present

## 2018-06-02 DIAGNOSIS — E7800 Pure hypercholesterolemia, unspecified: Secondary | ICD-10-CM | POA: Diagnosis not present

## 2018-08-10 DIAGNOSIS — M79671 Pain in right foot: Secondary | ICD-10-CM | POA: Diagnosis not present

## 2018-08-10 DIAGNOSIS — M10071 Idiopathic gout, right ankle and foot: Secondary | ICD-10-CM | POA: Diagnosis not present

## 2018-08-10 DIAGNOSIS — M25571 Pain in right ankle and joints of right foot: Secondary | ICD-10-CM | POA: Diagnosis not present

## 2018-08-24 DIAGNOSIS — M25571 Pain in right ankle and joints of right foot: Secondary | ICD-10-CM | POA: Diagnosis not present

## 2018-08-24 DIAGNOSIS — M10071 Idiopathic gout, right ankle and foot: Secondary | ICD-10-CM | POA: Diagnosis not present

## 2018-11-02 ENCOUNTER — Other Ambulatory Visit: Payer: Self-pay | Admitting: Internal Medicine

## 2018-11-02 DIAGNOSIS — J309 Allergic rhinitis, unspecified: Secondary | ICD-10-CM | POA: Diagnosis not present

## 2018-11-02 DIAGNOSIS — Z1239 Encounter for other screening for malignant neoplasm of breast: Secondary | ICD-10-CM | POA: Diagnosis not present

## 2018-11-02 DIAGNOSIS — D709 Neutropenia, unspecified: Secondary | ICD-10-CM | POA: Diagnosis not present

## 2018-11-02 DIAGNOSIS — E538 Deficiency of other specified B group vitamins: Secondary | ICD-10-CM | POA: Diagnosis not present

## 2018-11-02 DIAGNOSIS — M25512 Pain in left shoulder: Secondary | ICD-10-CM | POA: Diagnosis not present

## 2018-11-02 DIAGNOSIS — E7800 Pure hypercholesterolemia, unspecified: Secondary | ICD-10-CM | POA: Diagnosis not present

## 2018-11-02 DIAGNOSIS — Z23 Encounter for immunization: Secondary | ICD-10-CM | POA: Diagnosis not present

## 2018-11-02 DIAGNOSIS — E559 Vitamin D deficiency, unspecified: Secondary | ICD-10-CM | POA: Diagnosis not present

## 2018-11-02 DIAGNOSIS — I10 Essential (primary) hypertension: Secondary | ICD-10-CM | POA: Diagnosis not present

## 2018-11-02 DIAGNOSIS — N182 Chronic kidney disease, stage 2 (mild): Secondary | ICD-10-CM | POA: Diagnosis not present

## 2018-11-02 DIAGNOSIS — Z79899 Other long term (current) drug therapy: Secondary | ICD-10-CM | POA: Diagnosis not present

## 2018-11-02 DIAGNOSIS — Z1231 Encounter for screening mammogram for malignant neoplasm of breast: Secondary | ICD-10-CM

## 2018-11-02 DIAGNOSIS — K219 Gastro-esophageal reflux disease without esophagitis: Secondary | ICD-10-CM | POA: Diagnosis not present

## 2018-11-02 DIAGNOSIS — Z0000 Encounter for general adult medical examination without abnormal findings: Secondary | ICD-10-CM | POA: Diagnosis not present

## 2018-11-02 DIAGNOSIS — M810 Age-related osteoporosis without current pathological fracture: Secondary | ICD-10-CM | POA: Diagnosis not present

## 2018-11-10 DIAGNOSIS — M5030 Other cervical disc degeneration, unspecified cervical region: Secondary | ICD-10-CM | POA: Diagnosis not present

## 2018-11-10 DIAGNOSIS — M542 Cervicalgia: Secondary | ICD-10-CM | POA: Diagnosis not present

## 2018-11-10 DIAGNOSIS — M5412 Radiculopathy, cervical region: Secondary | ICD-10-CM | POA: Diagnosis not present

## 2018-12-01 DIAGNOSIS — M5412 Radiculopathy, cervical region: Secondary | ICD-10-CM | POA: Diagnosis not present

## 2018-12-01 DIAGNOSIS — M5030 Other cervical disc degeneration, unspecified cervical region: Secondary | ICD-10-CM | POA: Diagnosis not present

## 2018-12-06 DIAGNOSIS — E7800 Pure hypercholesterolemia, unspecified: Secondary | ICD-10-CM | POA: Diagnosis not present

## 2018-12-06 DIAGNOSIS — I10 Essential (primary) hypertension: Secondary | ICD-10-CM | POA: Diagnosis not present

## 2018-12-06 DIAGNOSIS — R002 Palpitations: Secondary | ICD-10-CM | POA: Diagnosis not present

## 2019-01-26 ENCOUNTER — Ambulatory Visit
Admission: RE | Admit: 2019-01-26 | Discharge: 2019-01-26 | Disposition: A | Discharge status: Home / Self Care | Payer: PPO | Source: Ambulatory Visit | Attending: Internal Medicine | Admitting: Internal Medicine

## 2019-01-26 ENCOUNTER — Other Ambulatory Visit: Payer: Self-pay

## 2019-01-26 DIAGNOSIS — Z1231 Encounter for screening mammogram for malignant neoplasm of breast: Secondary | ICD-10-CM | POA: Diagnosis not present

## 2019-05-04 DIAGNOSIS — N182 Chronic kidney disease, stage 2 (mild): Secondary | ICD-10-CM | POA: Diagnosis not present

## 2019-05-04 DIAGNOSIS — E538 Deficiency of other specified B group vitamins: Secondary | ICD-10-CM | POA: Diagnosis not present

## 2019-05-04 DIAGNOSIS — M810 Age-related osteoporosis without current pathological fracture: Secondary | ICD-10-CM | POA: Diagnosis not present

## 2019-05-04 DIAGNOSIS — E7800 Pure hypercholesterolemia, unspecified: Secondary | ICD-10-CM | POA: Diagnosis not present

## 2019-05-04 DIAGNOSIS — J309 Allergic rhinitis, unspecified: Secondary | ICD-10-CM | POA: Diagnosis not present

## 2019-05-04 DIAGNOSIS — I10 Essential (primary) hypertension: Secondary | ICD-10-CM | POA: Diagnosis not present

## 2019-05-04 DIAGNOSIS — E559 Vitamin D deficiency, unspecified: Secondary | ICD-10-CM | POA: Diagnosis not present

## 2019-05-04 DIAGNOSIS — K219 Gastro-esophageal reflux disease without esophagitis: Secondary | ICD-10-CM | POA: Diagnosis not present

## 2019-05-04 DIAGNOSIS — Z79899 Other long term (current) drug therapy: Secondary | ICD-10-CM | POA: Diagnosis not present

## 2019-05-04 DIAGNOSIS — R002 Palpitations: Secondary | ICD-10-CM | POA: Diagnosis not present

## 2019-05-09 DIAGNOSIS — M810 Age-related osteoporosis without current pathological fracture: Secondary | ICD-10-CM | POA: Diagnosis not present

## 2019-06-02 DIAGNOSIS — I351 Nonrheumatic aortic (valve) insufficiency: Secondary | ICD-10-CM | POA: Diagnosis not present

## 2019-06-02 DIAGNOSIS — I10 Essential (primary) hypertension: Secondary | ICD-10-CM | POA: Diagnosis not present

## 2019-06-02 DIAGNOSIS — E7800 Pure hypercholesterolemia, unspecified: Secondary | ICD-10-CM | POA: Diagnosis not present

## 2019-06-02 DIAGNOSIS — I340 Nonrheumatic mitral (valve) insufficiency: Secondary | ICD-10-CM | POA: Diagnosis not present

## 2019-06-02 DIAGNOSIS — R002 Palpitations: Secondary | ICD-10-CM | POA: Diagnosis not present

## 2019-11-03 DIAGNOSIS — I10 Essential (primary) hypertension: Secondary | ICD-10-CM | POA: Diagnosis not present

## 2019-11-03 DIAGNOSIS — E559 Vitamin D deficiency, unspecified: Secondary | ICD-10-CM | POA: Diagnosis not present

## 2019-11-03 DIAGNOSIS — Z23 Encounter for immunization: Secondary | ICD-10-CM | POA: Diagnosis not present

## 2019-11-03 DIAGNOSIS — J309 Allergic rhinitis, unspecified: Secondary | ICD-10-CM | POA: Diagnosis not present

## 2019-11-03 DIAGNOSIS — E7800 Pure hypercholesterolemia, unspecified: Secondary | ICD-10-CM | POA: Diagnosis not present

## 2019-11-03 DIAGNOSIS — Z0000 Encounter for general adult medical examination without abnormal findings: Secondary | ICD-10-CM | POA: Diagnosis not present

## 2019-11-03 DIAGNOSIS — N182 Chronic kidney disease, stage 2 (mild): Secondary | ICD-10-CM | POA: Diagnosis not present

## 2019-11-03 DIAGNOSIS — M810 Age-related osteoporosis without current pathological fracture: Secondary | ICD-10-CM | POA: Diagnosis not present

## 2019-11-03 DIAGNOSIS — Z86010 Personal history of colonic polyps: Secondary | ICD-10-CM | POA: Diagnosis not present

## 2019-11-03 DIAGNOSIS — E538 Deficiency of other specified B group vitamins: Secondary | ICD-10-CM | POA: Diagnosis not present

## 2019-11-03 DIAGNOSIS — K219 Gastro-esophageal reflux disease without esophagitis: Secondary | ICD-10-CM | POA: Diagnosis not present

## 2019-11-03 DIAGNOSIS — Z79899 Other long term (current) drug therapy: Secondary | ICD-10-CM | POA: Diagnosis not present

## 2019-12-26 DIAGNOSIS — I10 Essential (primary) hypertension: Secondary | ICD-10-CM | POA: Diagnosis not present

## 2019-12-26 DIAGNOSIS — E7800 Pure hypercholesterolemia, unspecified: Secondary | ICD-10-CM | POA: Diagnosis not present

## 2019-12-26 DIAGNOSIS — I351 Nonrheumatic aortic (valve) insufficiency: Secondary | ICD-10-CM | POA: Diagnosis not present

## 2019-12-26 DIAGNOSIS — I340 Nonrheumatic mitral (valve) insufficiency: Secondary | ICD-10-CM | POA: Diagnosis not present

## 2019-12-26 DIAGNOSIS — R002 Palpitations: Secondary | ICD-10-CM | POA: Diagnosis not present

## 2020-01-04 ENCOUNTER — Other Ambulatory Visit: Payer: Self-pay | Admitting: Internal Medicine

## 2020-01-04 DIAGNOSIS — Z1231 Encounter for screening mammogram for malignant neoplasm of breast: Secondary | ICD-10-CM

## 2020-01-11 ENCOUNTER — Other Ambulatory Visit: Payer: Self-pay | Admitting: Internal Medicine

## 2020-01-11 DIAGNOSIS — Z1231 Encounter for screening mammogram for malignant neoplasm of breast: Secondary | ICD-10-CM

## 2020-01-30 ENCOUNTER — Other Ambulatory Visit: Payer: Self-pay

## 2020-01-30 ENCOUNTER — Ambulatory Visit
Admission: RE | Admit: 2020-01-30 | Discharge: 2020-01-30 | Disposition: A | Discharge status: Home / Self Care | Payer: PPO | Source: Ambulatory Visit | Attending: Internal Medicine | Admitting: Internal Medicine

## 2020-01-30 DIAGNOSIS — Z1231 Encounter for screening mammogram for malignant neoplasm of breast: Secondary | ICD-10-CM | POA: Insufficient documentation

## 2020-05-03 DIAGNOSIS — E559 Vitamin D deficiency, unspecified: Secondary | ICD-10-CM | POA: Diagnosis not present

## 2020-05-03 DIAGNOSIS — E538 Deficiency of other specified B group vitamins: Secondary | ICD-10-CM | POA: Diagnosis not present

## 2020-05-03 DIAGNOSIS — M810 Age-related osteoporosis without current pathological fracture: Secondary | ICD-10-CM | POA: Diagnosis not present

## 2020-05-03 DIAGNOSIS — J309 Allergic rhinitis, unspecified: Secondary | ICD-10-CM | POA: Diagnosis not present

## 2020-05-03 DIAGNOSIS — Z23 Encounter for immunization: Secondary | ICD-10-CM | POA: Diagnosis not present

## 2020-05-03 DIAGNOSIS — I10 Essential (primary) hypertension: Secondary | ICD-10-CM | POA: Diagnosis not present

## 2020-05-03 DIAGNOSIS — K219 Gastro-esophageal reflux disease without esophagitis: Secondary | ICD-10-CM | POA: Diagnosis not present

## 2020-05-03 DIAGNOSIS — R809 Proteinuria, unspecified: Secondary | ICD-10-CM | POA: Diagnosis not present

## 2020-05-03 DIAGNOSIS — Z79899 Other long term (current) drug therapy: Secondary | ICD-10-CM | POA: Diagnosis not present

## 2020-05-03 DIAGNOSIS — Z86010 Personal history of colonic polyps: Secondary | ICD-10-CM | POA: Diagnosis not present

## 2020-05-03 DIAGNOSIS — E7800 Pure hypercholesterolemia, unspecified: Secondary | ICD-10-CM | POA: Diagnosis not present

## 2020-05-03 DIAGNOSIS — N182 Chronic kidney disease, stage 2 (mild): Secondary | ICD-10-CM | POA: Diagnosis not present

## 2020-07-23 DIAGNOSIS — Z86010 Personal history of colonic polyps: Secondary | ICD-10-CM | POA: Diagnosis not present

## 2020-11-02 ENCOUNTER — Ambulatory Visit: Admission: RE | Admit: 2020-11-02 | Payer: PPO | Source: Home / Self Care

## 2020-12-25 DIAGNOSIS — E559 Vitamin D deficiency, unspecified: Secondary | ICD-10-CM | POA: Diagnosis not present

## 2020-12-25 DIAGNOSIS — R5383 Other fatigue: Secondary | ICD-10-CM | POA: Diagnosis not present

## 2020-12-25 DIAGNOSIS — E7800 Pure hypercholesterolemia, unspecified: Secondary | ICD-10-CM | POA: Diagnosis not present

## 2020-12-25 DIAGNOSIS — I10 Essential (primary) hypertension: Secondary | ICD-10-CM | POA: Diagnosis not present

## 2021-01-02 ENCOUNTER — Other Ambulatory Visit: Payer: Self-pay | Admitting: Adult Health

## 2021-01-02 DIAGNOSIS — M810 Age-related osteoporosis without current pathological fracture: Secondary | ICD-10-CM

## 2021-01-22 ENCOUNTER — Other Ambulatory Visit: Payer: Self-pay | Admitting: Adult Health

## 2021-01-22 DIAGNOSIS — Z1231 Encounter for screening mammogram for malignant neoplasm of breast: Secondary | ICD-10-CM

## 2021-01-24 ENCOUNTER — Encounter: Payer: Self-pay | Admitting: Gastroenterology

## 2021-01-25 ENCOUNTER — Encounter: Admission: RE | Payer: Self-pay | Source: Home / Self Care

## 2021-01-25 ENCOUNTER — Encounter: Payer: Self-pay | Admitting: Gastroenterology

## 2021-01-25 ENCOUNTER — Ambulatory Visit: Payer: PPO | Admitting: Certified Registered Nurse Anesthetist

## 2021-01-25 ENCOUNTER — Ambulatory Visit
Admission: RE | Admit: 2021-01-25 | Discharge: 2021-01-25 | Disposition: A | Discharge status: Home / Self Care | Payer: PPO | Attending: Gastroenterology | Admitting: Gastroenterology

## 2021-01-25 ENCOUNTER — Other Ambulatory Visit: Payer: Self-pay

## 2021-01-25 ENCOUNTER — Encounter
Admission: RE | Disposition: A | Discharge status: Home / Self Care | Payer: Self-pay | Source: Home / Self Care | Attending: Gastroenterology

## 2021-01-25 DIAGNOSIS — K219 Gastro-esophageal reflux disease without esophagitis: Secondary | ICD-10-CM | POA: Insufficient documentation

## 2021-01-25 DIAGNOSIS — I351 Nonrheumatic aortic (valve) insufficiency: Secondary | ICD-10-CM | POA: Diagnosis not present

## 2021-01-25 DIAGNOSIS — E559 Vitamin D deficiency, unspecified: Secondary | ICD-10-CM | POA: Diagnosis not present

## 2021-01-25 DIAGNOSIS — Z86010 Personal history of colonic polyps: Secondary | ICD-10-CM | POA: Diagnosis not present

## 2021-01-25 DIAGNOSIS — E7800 Pure hypercholesterolemia, unspecified: Secondary | ICD-10-CM | POA: Diagnosis not present

## 2021-01-25 DIAGNOSIS — I129 Hypertensive chronic kidney disease with stage 1 through stage 4 chronic kidney disease, or unspecified chronic kidney disease: Secondary | ICD-10-CM | POA: Diagnosis not present

## 2021-01-25 DIAGNOSIS — M810 Age-related osteoporosis without current pathological fracture: Secondary | ICD-10-CM | POA: Diagnosis not present

## 2021-01-25 DIAGNOSIS — E538 Deficiency of other specified B group vitamins: Secondary | ICD-10-CM | POA: Diagnosis not present

## 2021-01-25 DIAGNOSIS — Z1211 Encounter for screening for malignant neoplasm of colon: Secondary | ICD-10-CM | POA: Diagnosis present

## 2021-01-25 DIAGNOSIS — I340 Nonrheumatic mitral (valve) insufficiency: Secondary | ICD-10-CM | POA: Insufficient documentation

## 2021-01-25 DIAGNOSIS — N182 Chronic kidney disease, stage 2 (mild): Secondary | ICD-10-CM | POA: Diagnosis not present

## 2021-01-25 DIAGNOSIS — D709 Neutropenia, unspecified: Secondary | ICD-10-CM | POA: Insufficient documentation

## 2021-01-25 DIAGNOSIS — K5730 Diverticulosis of large intestine without perforation or abscess without bleeding: Secondary | ICD-10-CM | POA: Insufficient documentation

## 2021-01-25 HISTORY — DX: Nonrheumatic mitral (valve) insufficiency: I34.0

## 2021-01-25 HISTORY — DX: Chronic kidney disease, stage 2 (mild): N18.2

## 2021-01-25 HISTORY — DX: Nonrheumatic aortic (valve) insufficiency: I35.1

## 2021-01-25 HISTORY — PX: COLONOSCOPY WITH PROPOFOL: SHX5780

## 2021-01-25 SURGERY — COLONOSCOPY WITH PROPOFOL
Anesthesia: General

## 2021-01-25 MED ORDER — LIDOCAINE HCL (CARDIAC) PF 100 MG/5ML IV SOSY
PREFILLED_SYRINGE | INTRAVENOUS | Status: DC | PRN
  Administered 2021-01-25: 60 mg via INTRAVENOUS

## 2021-01-25 MED ORDER — PROPOFOL 10 MG/ML IV BOLUS
INTRAVENOUS | Status: DC | PRN
Start: 2021-01-25 — End: 2021-01-25
  Administered 2021-01-25: 50 mg via INTRAVENOUS
  Administered 2021-01-25: 20 mg via INTRAVENOUS

## 2021-01-25 MED ORDER — SODIUM CHLORIDE 0.9 % IV SOLN: INTRAVENOUS | Status: DC

## 2021-01-25 MED ORDER — PROPOFOL 500 MG/50ML IV EMUL
INTRAVENOUS | Status: DC | PRN
  Administered 2021-01-25: 140 ug/kg/min via INTRAVENOUS

## 2021-01-25 MED ORDER — GLYCOPYRROLATE 0.2 MG/ML IJ SOLN
INTRAMUSCULAR | Status: DC | PRN
  Administered 2021-01-25: .2 mg via INTRAVENOUS

## 2021-01-25 NOTE — Interval H&P Note (Signed)
History and Physical Interval Note:  01/25/2021 1:28 PM  Melanie Ali  has presented today for surgery, with the diagnosis of ADEN POLYPS.  The various methods of treatment have been discussed with the patient and family. After consideration of risks, benefits and other options for treatment, the patient has consented to  Procedure(s): COLONOSCOPY WITH PROPOFOL (N/A) as a surgical intervention.  The patient's history has been reviewed, patient examined, no change in status, stable for surgery.  I have reviewed the patient's chart and labs.  Questions were answered to the patient's satisfaction.     Lesly Rubenstein  Ok to proceed with colonoscopy

## 2021-01-25 NOTE — Anesthesia Procedure Notes (Signed)
Date/Time: 01/25/2021 1:33 PM Performed by: Lily Peer, Samanvi Cuccia, CRNA Pre-anesthesia Checklist: Patient identified, Emergency Drugs available, Suction available, Patient being monitored and Timeout performed Patient Re-evaluated:Patient Re-evaluated prior to induction Oxygen Delivery Method: Nasal cannula Induction Type: IV induction

## 2021-01-25 NOTE — Transfer of Care (Signed)
Immediate Anesthesia Transfer of Care Note  Patient: Parrish A Stormes  Procedure(s) Performed: COLONOSCOPY WITH PROPOFOL  Patient Location: Endoscopy Unit  Anesthesia Type:General  Level of Consciousness: sedated  Airway & Oxygen Therapy: Patient Spontanous Breathing  Post-op Assessment: Report given to RN and Post -op Vital signs reviewed and stable  Post vital signs: Reviewed and stable  Last Vitals:  Vitals Value Taken Time  BP 129/86 01/25/21 1355  Temp    Pulse 60 01/25/21 1356  Resp 18 01/25/21 1356  SpO2 100 % 01/25/21 1356  Vitals shown include unvalidated device data.  Last Pain:  Vitals:   01/25/21 1240  TempSrc: Temporal  PainSc: 0-No pain         Complications: No notable events documented.

## 2021-01-25 NOTE — H&P (Signed)
Outpatient short stay form Pre-procedure 01/25/2021  Lesly Rubenstein, MD  Primary Physician: Ezequiel Kayser, MD (Inactive)  Reason for visit:  Surveillance colonoscopy  History of present illness:    76 y/o lady with history of hypertension here for surveillance colonoscopy. Last colonoscopy was > 5 years ago with small tubular adenoma. No family history of GI malignancies. No blood thinners. No significant abdominal surgeries. No new symptoms.    Current Facility-Administered Medications:    0.9 %  sodium chloride infusion, , Intravenous, Continuous, Aiysha Jillson, Hilton Cork, MD, Last Rate: 20 mL/hr at 01/25/21 1258, Continued from Pre-op at 01/25/21 1258  Medications Prior to Admission  Medication Sig Dispense Refill Last Dose   acetaminophen (TYLENOL) 500 MG tablet Take 500 mg by mouth every 6 (six) hours as needed.      amLODipine (NORVASC) 5 MG tablet Take 5 mg by mouth daily.   01/24/2021   cyanocobalamin 500 MCG tablet Take 500 mcg by mouth daily.   01/24/2021   lisinopril-hydrochlorothiazide (PRINZIDE,ZESTORETIC) 20-25 MG tablet Take 1 tablet by mouth daily.   01/24/2021   omeprazole (PRILOSEC) 20 MG capsule Take 20 mg by mouth daily.   01/24/2021   pravastatin (PRAVACHOL) 20 MG tablet Take 20 mg by mouth daily.   01/24/2021   alendronate (FOSAMAX) 70 MG tablet Take 70 mg by mouth once a week. Take with a full glass of water on an empty stomach. (Patient not taking: Reported on 01/25/2021)   Not Taking   fluticasone (FLONASE) 50 MCG/ACT nasal spray Place 2 sprays into both nostrils daily. (Patient not taking: Reported on 01/25/2021)   Not Taking   loratadine (CLARITIN) 10 MG tablet Take 10 mg by mouth daily. (Patient not taking: Reported on 01/25/2021)   Not Taking   naproxen sodium (ANAPROX) 220 MG tablet Take 220 mg by mouth 2 (two) times daily with a meal. (Patient not taking: Reported on 01/25/2021)   Not Taking   polyethylene glycol powder (GLYCOLAX/MIRALAX) powder Take 1 Container by  mouth once. (Patient not taking: Reported on 01/25/2021)   Completed Course   Vitamin D, Ergocalciferol, (DRISDOL) 50000 units CAPS capsule Take 50,000 Units by mouth every 14 (fourteen) days. (Patient not taking: Reported on 01/25/2021)   Not Taking     No Known Allergies   Past Medical History:  Diagnosis Date   Adenomatous polyp of colon    B12 deficiency    Chronic renal insufficiency, stage 2 (mild)    GERD (gastroesophageal reflux disease)    Hypercholesterolemia    Hypertension    Neutropenia (HCC)    Nonrheumatic aortic (valve) insufficiency    Nonrheumatic mitral valve regurgitation    Osteoporosis    Vitamin D deficiency     Review of systems:  Otherwise negative.    Physical Exam  Gen: Alert, oriented. Appears stated age.  HEENT: PERRLA. Lungs: No respiratory distress CV: RRR Abd: soft, benign, no masses Ext: No edema    Planned procedures: Proceed with colonoscopy. The patient understands the nature of the planned procedure, indications, risks, alternatives and potential complications including but not limited to bleeding, infection, perforation, damage to internal organs and possible oversedation/side effects from anesthesia. The patient agrees and gives consent to proceed.  Please refer to procedure notes for findings, recommendations and patient disposition/instructions.     Lesly Rubenstein, MD [facility]

## 2021-01-25 NOTE — Op Note (Signed)
[facility] Gastroenterology Patient Name: Melanie Ali Procedure Date: 01/25/2021 1:21 PM MRN: [id number] Account #: [id number] Date of Birth: 10-Jun-1945 Admit Type: Outpatient Age: 76 Room: [facility] ENDO ROOM 3 Gender: Female Note Status: Finalized Instrument Name: Peds Colonoscope [id number] Procedure:             Colonoscopy Indications:           Surveillance: Personal history of adenomatous polyps                         on last colonoscopy > 5 years ago Providers:             Andrey Farmer MD, MD Referring MD:          Christena Flake. Raechel Ache, MD (Referring MD) Medicines:             Monitored Anesthesia Care Complications:         No immediate complications. Procedure:             Pre-Anesthesia Assessment:                        - Prior to the procedure, a History and Physical was                         performed, and patient medications and allergies were                         reviewed. The patient is competent. The risks and                         benefits of the procedure and the sedation options and                         risks were discussed with the patient. All questions                         were answered and informed consent was obtained.                         Patient identification and proposed procedure were                         verified by the physician, the nurse, the                         anesthesiologist, the anesthetist and the technician                         in the endoscopy suite. Mental Status Examination:                         alert and oriented. Airway Examination: normal                         oropharyngeal airway and neck mobility. Respiratory                         Examination: clear to auscultation. CV Examination:  normal. Prophylactic Antibiotics: The patient does not                         require prophylactic antibiotics. Prior                         Anticoagulants: The patient has taken no  previous                         anticoagulant or antiplatelet agents. ASA Grade                         Assessment: II - A patient with mild systemic disease.                         After reviewing the risks and benefits, the patient                         was deemed in satisfactory condition to undergo the                         procedure. The anesthesia plan was to use monitored                         anesthesia care (MAC). Immediately prior to                         administration of medications, the patient was                         re-assessed for adequacy to receive sedatives. The                         heart rate, respiratory rate, oxygen saturations,                         blood pressure, adequacy of pulmonary ventilation, and                         response to care were monitored throughout the                         procedure. The physical status of the patient was                         re-assessed after the procedure.                        After obtaining informed consent, the colonoscope was                         passed under direct vision. Throughout the procedure,                         the patient's blood pressure, pulse, and oxygen                         saturations were monitored continuously. The  Colonoscope was introduced through the anus and                         advanced to the the cecum, identified by appendiceal                         orifice and ileocecal valve. The colonoscopy was                         performed without difficulty. The patient tolerated                         the procedure well. The quality of the bowel                         preparation was good. Findings:      The perianal and digital rectal examinations were normal.      Multiple small and large-mouthed diverticula were found in the sigmoid       colon, descending colon, transverse colon, hepatic flexure and ascending       colon.      The exam  was otherwise without abnormality on direct and retroflexion       views. Impression:            - Diverticulosis in the sigmoid colon, in the                         descending colon, in the transverse colon, at the                         hepatic flexure and in the ascending colon.                        - The examination was otherwise normal on direct and                         retroflexion views.                        - No specimens collected. Recommendation:        - Discharge patient to home.                        - Resume previous diet.                        - Continue present medications.                        - Repeat colonoscopy is not recommended due to current                         age (86 years or older) for surveillance.                        - Return to referring physician as previously                         scheduled. Procedure Code(s):     --- Professional ---  G0105, Colorectal cancer screening; colonoscopy on                         individual at high risk Diagnosis Code(s):     --- Professional ---                        Z86.010, Personal history of colonic polyps                        K57.30, Diverticulosis of large intestine without                         perforation or abscess without bleeding CPT copyright 2019 [employer]. All rights reserved. The codes documented in this report are preliminary and upon coder review may  be revised to meet current compliance requirements. Andrey Farmer MD, MD 01/25/2021 1:54:10 PM Number of Addenda: 0 Note Initiated On: 01/25/2021 1:21 PM Scope Withdrawal Time: 0 hours 9 minutes 16 seconds  Total Procedure Duration: 0 hours 17 minutes 4 seconds  Estimated Blood Loss:  Estimated blood loss: none.      [facility]

## 2021-01-25 NOTE — Anesthesia Preprocedure Evaluation (Signed)
Anesthesia Evaluation  Patient identified by MRN, date of birth, ID band Patient awake    Reviewed: Allergy & Precautions, NPO status , Patient's Chart, lab work & pertinent test results  Airway Mallampati: III  TM Distance: >3 FB Neck ROM: full    Dental  (+) Chipped, Poor Dentition, Missing, Partial Lower, Partial Upper   Pulmonary neg pulmonary ROS, neg shortness of breath,    Pulmonary exam normal        Cardiovascular Exercise Tolerance: Good hypertension, (-) angina(-) Past MI Normal cardiovascular exam     Neuro/Psych negative neurological ROS  negative psych ROS   GI/Hepatic Neg liver ROS, GERD  Medicated and Controlled,  Endo/Other  negative endocrine ROS  Renal/GU Renal disease  negative genitourinary   Musculoskeletal   Abdominal   Peds  Hematology negative hematology ROS (+)   Anesthesia Other Findings Past Medical History: No date: Adenomatous polyp of colon No date: B12 deficiency No date: Chronic renal insufficiency, stage 2 (mild) No date: GERD (gastroesophageal reflux disease) No date: Hypercholesterolemia No date: Hypertension No date: Neutropenia (HCC) No date: Nonrheumatic aortic (valve) insufficiency No date: Nonrheumatic mitral valve regurgitation No date: Osteoporosis No date: Vitamin D deficiency  Past Surgical History: 07/17/2015: COLONOSCOPY WITH PROPOFOL; N/A     Comment:  Procedure: COLONOSCOPY WITH PROPOFOL;  Surgeon: [name], MD;  Location: [facility] ENDOSCOPY;  Service:               Endoscopy;  Laterality: N/A;  BMI    Body Mass Index: 21.79 kg/m      Reproductive/Obstetrics negative OB ROS                             Anesthesia Physical Anesthesia Plan  ASA: 3  Anesthesia Plan: General   Post-op Pain Management:    Induction: Intravenous  PONV Risk Score and Plan: Propofol infusion and TIVA  Airway Management  Planned: Natural Airway and Nasal Cannula  Additional Equipment:   Intra-op Plan:   Post-operative Plan:   Informed Consent: I have reviewed the patients History and Physical, chart, labs and discussed the procedure including the risks, benefits and alternatives for the proposed anesthesia with the patient or authorized representative who has indicated his/her understanding and acceptance.     Dental Advisory Given  Plan Discussed with: Anesthesiologist, CRNA and Surgeon  Anesthesia Plan Comments: (Patient consented for risks of anesthesia including but not limited to:  - adverse reactions to medications - risk of airway placement if required - damage to eyes, teeth, lips or other oral mucosa - nerve damage due to positioning  - sore throat or hoarseness - Damage to heart, brain, nerves, lungs, other parts of body or loss of life  Patient voiced understanding.)        Anesthesia Quick Evaluation

## 2021-01-28 ENCOUNTER — Encounter: Payer: Self-pay | Admitting: Gastroenterology

## 2021-01-28 NOTE — Anesthesia Postprocedure Evaluation (Signed)
Anesthesia Post Note  Patient: Melanie Ali  Procedure(s) Performed: COLONOSCOPY WITH PROPOFOL  Patient location during evaluation: Endoscopy Anesthesia Type: General Level of consciousness: awake and alert Pain management: pain level controlled Vital Signs Assessment: post-procedure vital signs reviewed and stable Respiratory status: spontaneous breathing, nonlabored ventilation, respiratory function stable and patient connected to nasal cannula oxygen Cardiovascular status: blood pressure returned to baseline and stable Postop Assessment: no apparent nausea or vomiting Anesthetic complications: no   No notable events documented.   Last Vitals:  Vitals:   01/25/21 1356 01/25/21 1416  BP:  (!) 122/98  Pulse:    Resp:    Temp: 36.8 C   SpO2:      Last Pain:  Vitals:   01/25/21 1416  TempSrc:   PainSc: 0-No pain                 Precious Haws Keslee Harrington

## 2021-02-13 ENCOUNTER — Ambulatory Visit
Admission: RE | Admit: 2021-02-13 | Discharge: 2021-02-13 | Disposition: A | Discharge status: Home / Self Care | Payer: PPO | Source: Ambulatory Visit | Attending: Adult Health | Admitting: Adult Health

## 2021-02-13 ENCOUNTER — Other Ambulatory Visit: Payer: Self-pay

## 2021-02-13 DIAGNOSIS — Z1231 Encounter for screening mammogram for malignant neoplasm of breast: Secondary | ICD-10-CM | POA: Insufficient documentation

## 2021-04-23 IMAGING — DX PORTABLE CHEST - 1 VIEW
1 series · 1 of 1 positions shown · non-contrast
Comparison: None.

CLINICAL DATA: Chest pain and shortness of breath since [REDACTED].
Possible Pedro Filho posterior.

EXAM:
PORTABLE CHEST 1 VIEW

[chest ap]
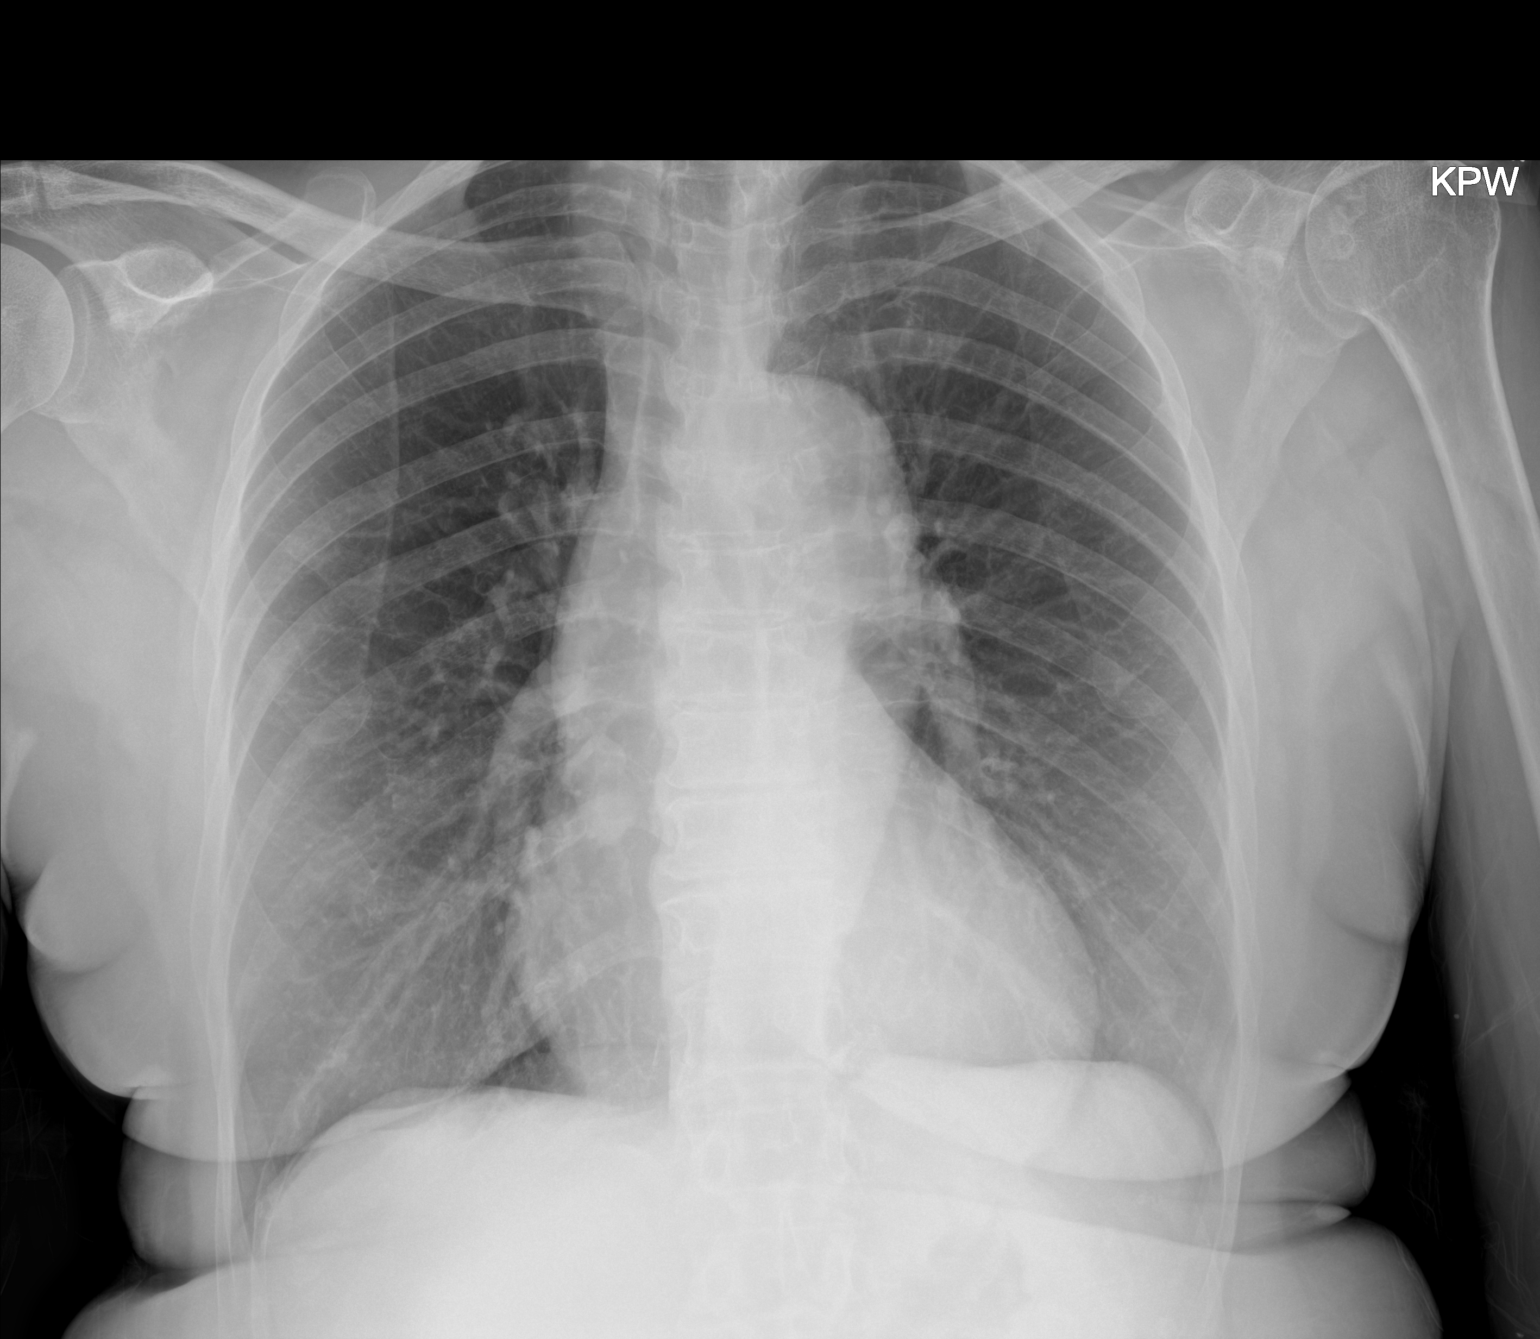

[1 of 1 positions shown; findings below may reference images not displayed]

FINDINGS: Midline trachea. Normal heart size. Tortuous thoracic aorta. No
pleural effusion or pneumothorax. Clear lungs.
IMPRESSION: No acute cardiopulmonary disease.

## 2022-01-27 ENCOUNTER — Other Ambulatory Visit: Payer: Self-pay | Admitting: Internal Medicine

## 2022-01-27 DIAGNOSIS — Z1231 Encounter for screening mammogram for malignant neoplasm of breast: Secondary | ICD-10-CM

## 2022-02-17 ENCOUNTER — Ambulatory Visit
Admission: RE | Admit: 2022-02-17 | Discharge: 2022-02-17 | Disposition: A | Discharge status: Home / Self Care | Payer: PPO | Source: Ambulatory Visit | Attending: Internal Medicine | Admitting: Internal Medicine

## 2022-02-17 DIAGNOSIS — Z1231 Encounter for screening mammogram for malignant neoplasm of breast: Secondary | ICD-10-CM

## 2023-01-20 ENCOUNTER — Other Ambulatory Visit: Payer: Self-pay | Admitting: Family Medicine

## 2023-01-20 DIAGNOSIS — Z1231 Encounter for screening mammogram for malignant neoplasm of breast: Secondary | ICD-10-CM

## 2023-02-25 ENCOUNTER — Inpatient Hospital Stay: Admission: RE | Admit: 2023-02-25 | Payer: PPO | Source: Ambulatory Visit

## 2023-03-04 ENCOUNTER — Ambulatory Visit
Admission: RE | Admit: 2023-03-04 | Discharge: 2023-03-04 | Disposition: A | Discharge status: Home / Self Care | Payer: PPO | Source: Ambulatory Visit | Attending: Family Medicine | Admitting: Family Medicine

## 2023-03-04 DIAGNOSIS — Z1231 Encounter for screening mammogram for malignant neoplasm of breast: Secondary | ICD-10-CM | POA: Insufficient documentation

## 2024-01-31 IMAGING — MG MM DIGITAL SCREENING BILAT W/ TOMO AND CAD
6 of 10 series · 6 of 30 positions shown · non-contrast
Comparison: Previous exam(s).

CLINICAL DATA: Screening.

EXAM:
DIGITAL SCREENING BILATERAL MAMMOGRAM WITH TOMOSYNTHESIS AND CAD
TECHNIQUE: Bilateral screening digital craniocaudal and mediolateral oblique
mammograms were obtained. Bilateral screening digital breast
tomosynthesis was performed. The images were evaluated with
computer-aided detection.

[L CC synth-2D]
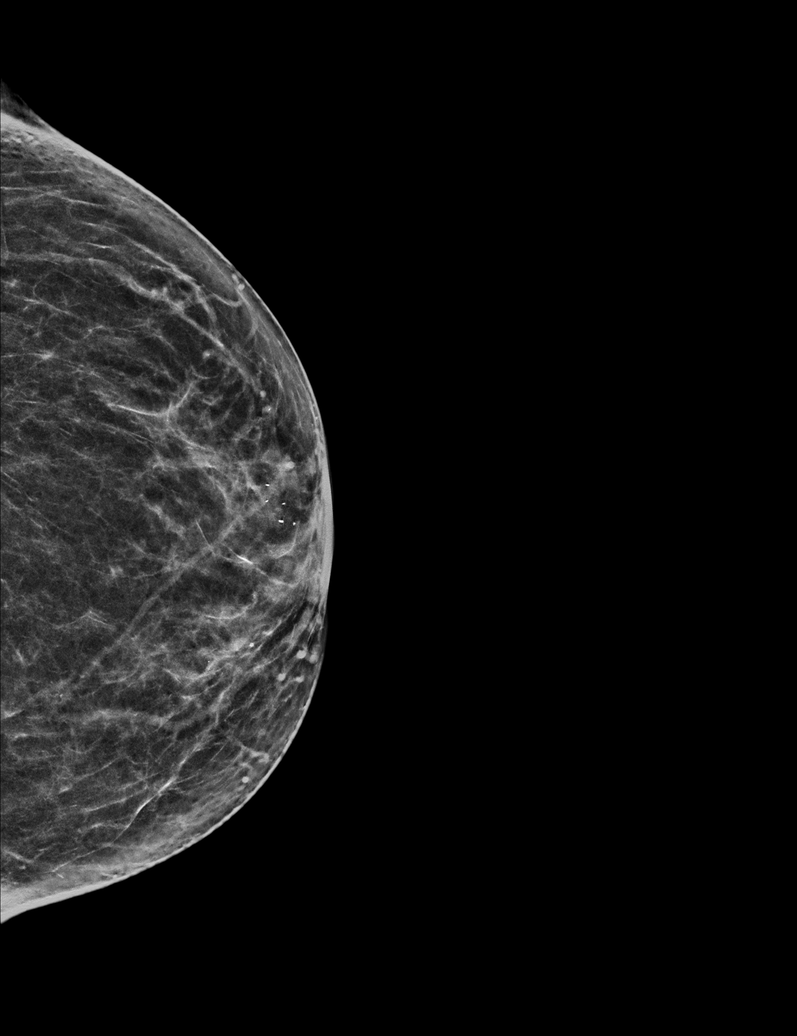

[R MLO synth-2D]
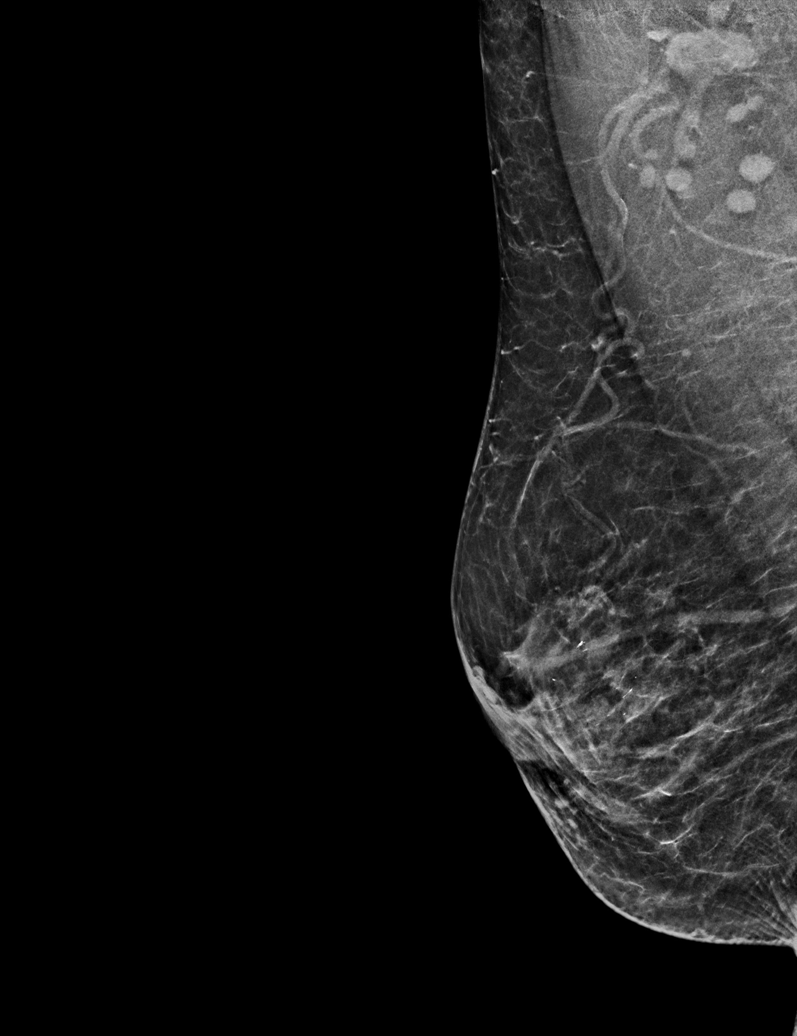

[L MLO synth-2D]
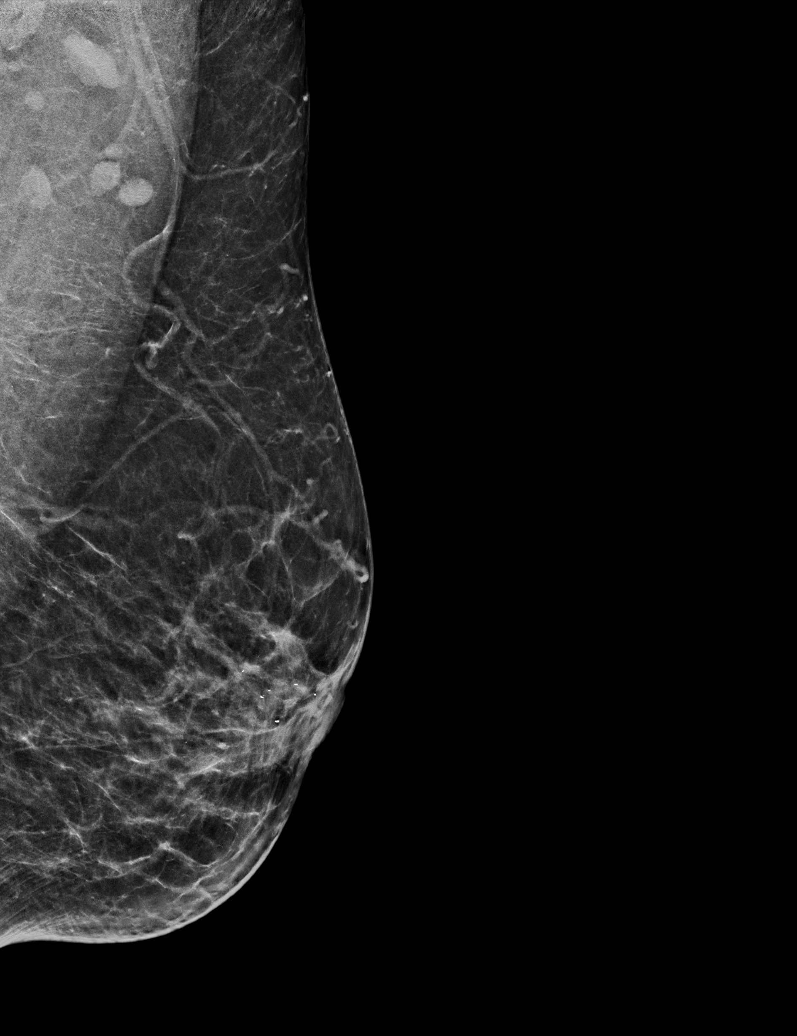

[R CC synth-2D (1 of 2)]
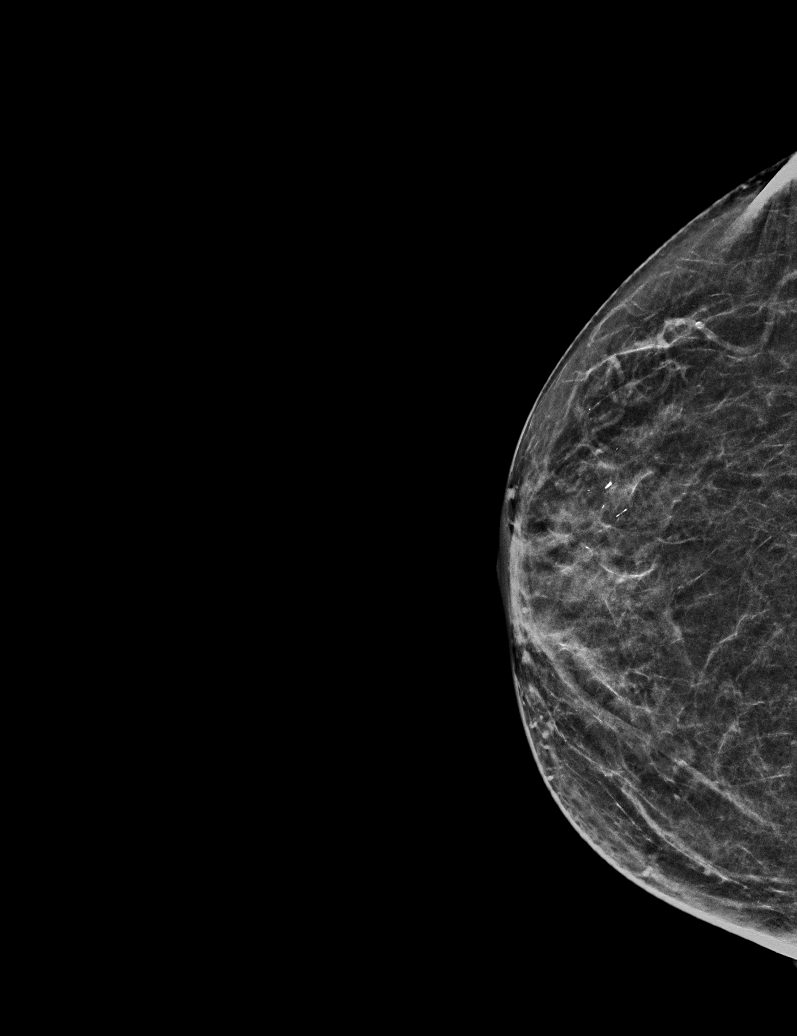

[R CC synth-2D (2 of 2)]
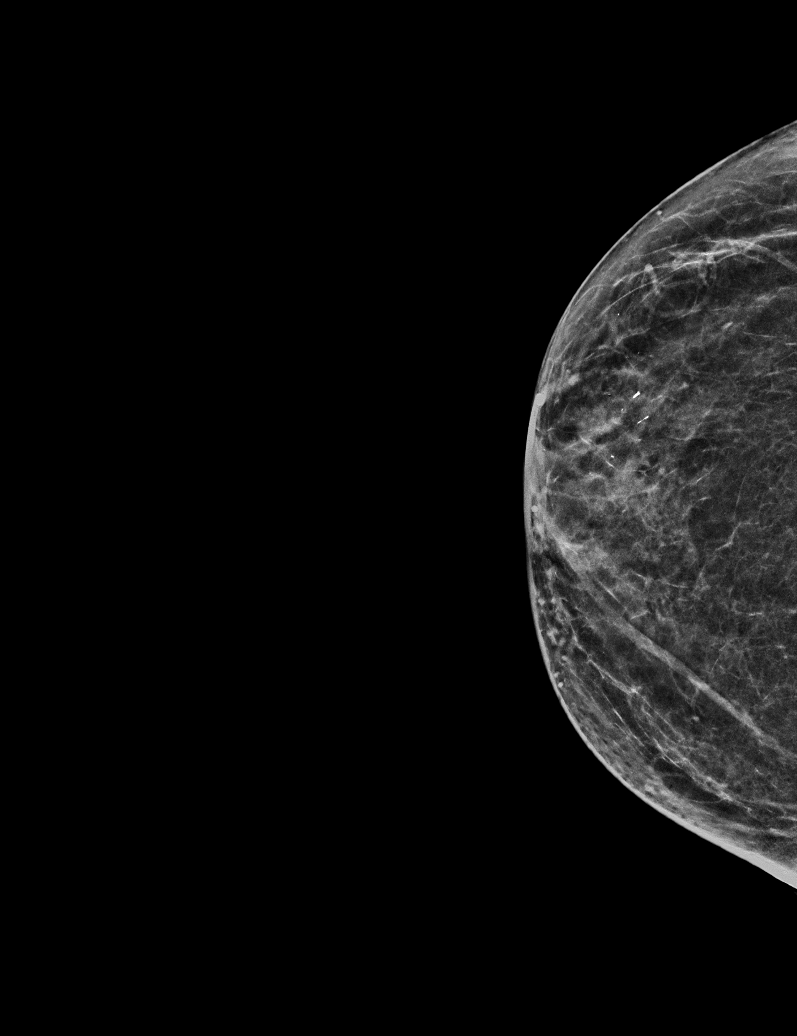

[R CC tomo · tomo slice 25/49.0]
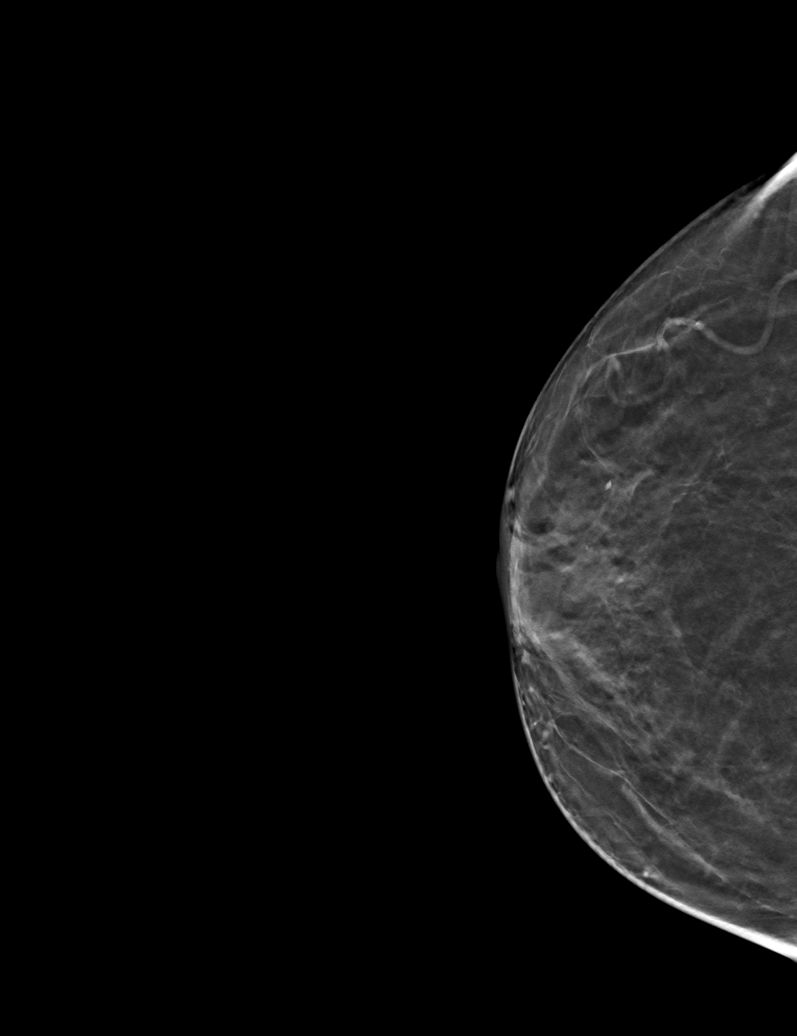

[6 of 30 positions shown; findings below may reference images not displayed]

ACR Breast Density Category c: The breast tissue is heterogeneously
dense, which may obscure small masses.
FINDINGS: There are no findings suspicious for malignancy.
IMPRESSION: No mammographic evidence of malignancy. A result letter of this
screening mammogram will be mailed directly to the patient.

RECOMMENDATION:
Screening mammogram in one year. (Code:Q3-W-BC3)

BI-RADS CATEGORY  1: Negative.
